# Patient Record
Sex: Male | Born: 1980
Health system: Southern US, Community
[De-identification: ages and names within clinical notes are randomized; demographics above are authoritative.]

## PROBLEM LIST (undated history)

## (undated) DIAGNOSIS — G43909 Migraine, unspecified, not intractable, without status migrainosus: Secondary | ICD-10-CM

## (undated) DIAGNOSIS — G4730 Sleep apnea, unspecified: Secondary | ICD-10-CM

## (undated) DIAGNOSIS — J45909 Unspecified asthma, uncomplicated: Secondary | ICD-10-CM

## (undated) HISTORY — DX: Unspecified asthma, uncomplicated: J45.909

## (undated) HISTORY — PX: OTHER SURGICAL HISTORY: SHX169

## (undated) HISTORY — DX: Migraine, unspecified, not intractable, without status migrainosus: G43.909

## (undated) HISTORY — DX: Sleep apnea, unspecified: G47.30

---

## 2008-08-15 ENCOUNTER — Ambulatory Visit: Payer: Self-pay | Admitting: Internal Medicine

## 2008-08-15 DIAGNOSIS — J3489 Other specified disorders of nose and nasal sinuses: Secondary | ICD-10-CM

## 2013-09-15 ENCOUNTER — Ambulatory Visit
Admission: RE | Admit: 2013-09-15 | Discharge: 2013-09-15 | Disposition: A | Discharge status: Home / Self Care | Payer: No Typology Code available for payment source | Source: Ambulatory Visit | Attending: Occupational Medicine | Admitting: Occupational Medicine

## 2013-09-15 ENCOUNTER — Other Ambulatory Visit: Payer: Self-pay | Admitting: Occupational Medicine

## 2013-09-15 DIAGNOSIS — Z021 Encounter for pre-employment examination: Secondary | ICD-10-CM

## 2014-05-11 ENCOUNTER — Emergency Department
Admission: EM | Admit: 2014-05-11 | Discharge: 2014-05-11 | Disposition: A | Discharge status: Home / Self Care | Payer: 59 | Source: Home / Self Care | Attending: Emergency Medicine | Admitting: Emergency Medicine

## 2014-05-11 ENCOUNTER — Encounter: Payer: Self-pay | Admitting: Emergency Medicine

## 2014-05-11 ENCOUNTER — Emergency Department (INDEPENDENT_AMBULATORY_CARE_PROVIDER_SITE_OTHER): Payer: 59

## 2014-05-11 DIAGNOSIS — K8050 Calculus of bile duct without cholangitis or cholecystitis without obstruction: Secondary | ICD-10-CM

## 2014-05-11 DIAGNOSIS — R9389 Abnormal findings on diagnostic imaging of other specified body structures: Secondary | ICD-10-CM

## 2014-05-11 DIAGNOSIS — K8020 Calculus of gallbladder without cholecystitis without obstruction: Secondary | ICD-10-CM

## 2014-05-11 DIAGNOSIS — R1011 Right upper quadrant pain: Secondary | ICD-10-CM

## 2014-05-11 LAB — COMPREHENSIVE METABOLIC PANEL
ALK PHOS: 114 U/L (ref 39–117)
ALT: 44 U/L (ref 0–53)
AST: 27 U/L (ref 0–37)
Albumin: 4.8 g/dL (ref 3.5–5.2)
BUN: 20 mg/dL (ref 6–23)
CO2: 31 mEq/L (ref 19–32)
Calcium: 9.3 mg/dL (ref 8.4–10.5)
Chloride: 102 mEq/L (ref 96–112)
Creat: 1.13 mg/dL (ref 0.50–1.35)
Glucose, Bld: 97 mg/dL (ref 70–99)
Potassium: 4.3 mEq/L (ref 3.5–5.3)
SODIUM: 138 meq/L (ref 135–145)
TOTAL PROTEIN: 7.5 g/dL (ref 6.0–8.3)
Total Bilirubin: 0.5 mg/dL (ref 0.2–1.2)

## 2014-05-11 LAB — POCT URINALYSIS DIP (MANUAL ENTRY)
Bilirubin, UA: NEGATIVE
Blood, UA: NEGATIVE
GLUCOSE UA: NEGATIVE
Ketones, POC UA: NEGATIVE
Leukocytes, UA: NEGATIVE
Nitrite, UA: NEGATIVE
PROTEIN UA: NEGATIVE
UROBILINOGEN UA: 0.2 (ref 0–1)
pH, UA: 6 (ref 5–8)

## 2014-05-11 LAB — POCT CBC W AUTO DIFF (K'VILLE URGENT CARE)

## 2014-05-11 LAB — AMYLASE: AMYLASE: 39 U/L (ref 0–105)

## 2014-05-11 LAB — LIPASE: Lipase: 49 U/L (ref 0–75)

## 2014-05-11 MED ORDER — HYDROCODONE-ACETAMINOPHEN 5-325 MG PO TABS
1.0000 | ORAL_TABLET | ORAL | Status: DC | PRN
Start: 2014-05-11 — End: 2014-08-24

## 2014-05-11 MED ORDER — GI COCKTAIL ~~LOC~~: 30.0000 mL | Freq: Once | ORAL | Status: DC

## 2014-05-11 NOTE — Discharge Instructions (Signed)
On the ultrasound, there is evidence of a 3 mm gallstone, but no evidence of blockage or cholecystitis.  CBC normal. Urinalysis normal. Complete metabolic panel, including liver tests are all within normal limits. Amylase and lipase (pancreas tests) normal.  Medications: GI cocktail given today in urgent care. Take Prilosec twice a day x14 days. Vicodin only if needed for severe pain. Followup with gastroenterologist (GI specialty Dr.) In one week. If any severe or worsening symptoms, go to emergency room immediately.   Biliary Colic  Biliary colic is a steady or irregular pain in the upper abdomen. It is usually under the right side of the rib cage. It happens when gallstones interfere with the normal flow of bile from the gallbladder. Bile is a liquid that helps to digest fats. Bile is made in the liver and stored in the gallbladder. When you eat a meal, bile passes from the gallbladder through the cystic duct and the common bile duct into the small intestine. There, it mixes with partially digested food. If a gallstone blocks either of these ducts, the normal flow of bile is blocked. The muscle cells in the bile duct contract forcefully to try to move the stone. This causes the pain of biliary colic.  SYMPTOMS   A person with biliary colic usually complains of pain in the upper abdomen. This pain can be:  In the center of the upper abdomen just below the breastbone.  In the upper-right part of the abdomen, near the gallbladder and liver.  Spread back toward the right shoulder blade.  Nausea and vomiting.  The pain usually occurs after eating.  Biliary colic is usually triggered by the digestive system's demand for bile. The demand for bile is high after fatty meals. Symptoms can also occur when a person who has been fasting suddenly eats a very large meal. Most episodes of biliary colic pass after 1 to 5 hours. After the most intense pain passes, your abdomen may continue to ache  mildly for about 24 hours. DIAGNOSIS  After you describe your symptoms, your caregiver will perform a physical exam. He or she will pay attention to the upper right portion of your belly (abdomen). This is the area of your liver and gallbladder. An ultrasound will help your caregiver look for gallstones. Specialized scans of the gallbladder may also be done. Blood tests may be done, especially if you have fever or if your pain persists. PREVENTION  Biliary colic can be prevented by controlling the risk factors for gallstones. Some of these risk factors, such as heredity, increasing age, and pregnancy are a normal part of life. Obesity and a high-fat diet are risk factors you can change through a healthy lifestyle. Women going through menopause who take hormone replacement therapy (estrogen) are also more likely to develop biliary colic. TREATMENT   Pain medication may be prescribed.  You may be encouraged to eat a fat-free diet.  If the first episode of biliary colic is severe, or episodes of colic keep retuning, surgery to remove the gallbladder (cholecystectomy) is usually recommended. This procedure can be done through small incisions using an instrument called a laparoscope. The procedure often requires a brief stay in the hospital. Some people can leave the hospital the same day. It is the most widely used treatment in people troubled by painful gallstones. It is effective and safe, with no complications in more than 90% of cases.  If surgery cannot be done, medication that dissolves gallstones may be used. This medication is  expensive and can take months or years to work. Only small stones will dissolve.  Rarely, medication to dissolve gallstones is combined with a procedure called shock-wave lithotripsy. This procedure uses carefully aimed shock waves to break up gallstones. In many people treated with this procedure, gallstones form again within a few years. PROGNOSIS  If gallstones block  your cystic duct or common bile duct, you are at risk for repeated episodes of biliary colic. There is also a 25% chance that you will develop a gallbladder infection(acute cholecystitis), or some other complication of gallstones within 10 to 20 years. If you have surgery, schedule it at a time that is convenient for you and at a time when you are not sick. HOME CARE INSTRUCTIONS   Drink plenty of clear fluids.  Avoid fatty, greasy or fried foods, or any foods that make your pain worse.  Take medications as directed. SEEK MEDICAL CARE IF:   You develop a fever over 100.5 F (38.1 C).  Your pain gets worse over time.  You develop nausea that prevents you from eating and drinking.  You develop vomiting. SEEK IMMEDIATE MEDICAL CARE IF:   You have continuous or severe belly (abdominal) pain which is not relieved with medications.  You develop nausea and vomiting which is not relieved with medications.  You have symptoms of biliary colic and you suddenly develop a fever and shaking chills. This may signal cholecystitis. Call your caregiver immediately.  You develop a yellow color to your skin or the white part of your eyes (jaundice). Document Released: 03/30/2006 Document Revised: 01/19/2012 Document Reviewed: 06/08/2008 [facility] Patient Information 2015 [employer] [place]. This information is not intended to replace advice given to you by your health care provider. Make sure you discuss any questions you have with your health care provider.

## 2014-05-11 NOTE — ED Notes (Signed)
Michael NoaWilliam complains of RUQ pain for 4 days. He reports the pain is worse at night while lying down. Denies fever, chills, sweats or blood in urine.

## 2014-05-11 NOTE — ED Provider Notes (Signed)
CSN: [id number]     Arrival date & time 05/11/14  0845 History   First MD Initiated Contact with Patient 05/11/14 [phone]     Chief Complaint  Patient presents with  . 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  [place] and reports not eating very well especially after being on recent diet.  She reports that the pain is located in the report right quadrant, worse at night, not colicky, mild during the day and moderate at night.  He does not recognize any pattern although he has not eaten any greasy fatty foods in the last few days because he is back on his diet.  Hurts when he takes a deep breath.  Rest does not help.  No trauma to that area, no heavy lifting pushing or pulling since he was on medication.  Medicines have not helped.  No hematuria, dysuria, polyuria.  No fever, chills, vomiting, night sweats.    History reviewed. No pertinent past medical history. History reviewed. No pertinent past surgical history. History reviewed. No pertinent family history. History  Substance Use Topics  . Smoking status: Never Smoker   . Smokeless tobacco: Never Used  . Alcohol Use: Yes    Review of Systems  All other systems reviewed and are negative.   Allergies  Review of patient's allergies indicates no known allergies.  Home Medications   Prior to Admission medications   Medication Sig Start Date End Date Taking? Authorizing Provider  fexofenadine (ALLEGRA) 180 MG tablet Take 180 mg by mouth daily.   Yes Historical Provider, MD   BP 134/86  Pulse 65  Temp(Src) 97.4 F (36.3 C) (Oral)  Ht 5\' 10"  (1.778 m)  Wt 249 lb (112.946 kg)  BMI 35.73 kg/m2  SpO2 98% Physical Exam  Nursing note and vitals reviewed. Constitutional: He is oriented to person, place, and time.  Vital signs are normal. He appears well-developed and well-nourished.  Non-toxic appearance. No distress.  HENT:  Head: Normocephalic and atraumatic.  Eyes: No scleral icterus.  Neck: Neck supple.  Cardiovascular: Normal rate, regular rhythm and normal heart sounds.   Pulmonary/Chest: Effort normal and breath sounds normal. No respiratory distress. He has no decreased breath sounds. He has no wheezes. He has no rhonchi.  Abdominal: Soft. Normal appearance. He exhibits no abdominal bruit, no ascites and no mass. There is no hepatosplenomegaly. There is tenderness in the right upper quadrant. There is positive Murphy's sign. There is no rigidity, no rebound, no guarding, no CVA tenderness and no tenderness at McBurney's point.  No tenderness on his right lower ribs.  No other tenderness throughout his abdomen, groin, back, flank.  Neurological: He is alert and oriented to person, place, and time.  Skin: Skin is warm and dry.  Psychiatric: He has a normal mood and affect. His speech is normal.    ED Course  Procedures (including critical care time) Labs Review Labs Reviewed  URINE CULTURE  COMPREHENSIVE METABOLIC PANEL  AMYLASE  LIPASE  POCT URINALYSIS DIP (MANUAL ENTRY)  POCT CBC W AUTO DIFF ([facility])    Imaging Review No results found. Results for orders placed during the hospital encounter of 05/11/14  POCT URINALYSIS DIP (MANUAL ENTRY)      Result Value Ref Range   Color, UA yellow     Clarity, UA clear  Glucose, UA neg     Bilirubin, UA negative     Bilirubin, UA negative     Spec Grav, UA <=1.005  1.005 - 1.03   Blood, UA negative     pH, UA 6.0  5 - 8   Protein Ur, POC negative     Urobilinogen, UA 0.2  0 - 1   Nitrite, UA Negative     Leukocytes, UA Negative        MDM   1. Abdominal pain, right upper quadrant     Urinalysis is performed which is negative for blood.  Urine culture was sent out.  An in-house a CBC was performed which has a white  blood cell count of 7.9 with a normal differential, hemoglobin 16.4, and platelets of 186.  Stat labs were sent for CMP, amylase, and lipase.  Most likely diagnosis is gallbladder etiology.  I have ordered a ultrasound of his abdomen to be performed today.  Afterward he will return to clinic for further evaluation and test results.  I opted for the ultrasound first prior to CT since he has been no right lower quadrant pain, however if continuing or worsening then a CT of his abdomen may be required to rule out appendix, kidney stones, etc all.  Patient is to be NPO until the test is performed.  Dr. Georgina PillionMassey will see patient when he returns later today to clinic.  Marlaine HindJeffrey H Trevin Gartrell, MD 05/11/14 [phone]

## 2014-05-11 NOTE — ED Provider Notes (Signed)
CSN: [id number]     Arrival date & time 05/11/14  0845 History   First MD Initiated Contact with Patient 05/11/14 [phone]     Chief Complaint  Patient presents with  . Abdominal Pain   These notes are continuation of visit here in urgent care. Please see Dr. Donnamarie Poag notes from here today in urgent care.--Dr. Orson Aloe signed out information about patient before finishing his urgent care shift.--The patient has just returned from abdominal ultrasound done here in our facility.  Please see below under imaging and MDM HPI  History reviewed. No pertinent past medical history. History reviewed. No pertinent past surgical history. History reviewed. No pertinent family history. History  Substance Use Topics  . Smoking status: Never Smoker   . Smokeless tobacco: Never Used  . Alcohol Use: Yes    Review of Systems  Allergies  Review of patient's allergies indicates no known allergies.  Home Medications   Prior to Admission medications   Medication Sig Start Date End Date Taking? Authorizing Provider  fexofenadine (ALLEGRA) 180 MG tablet Take 180 mg by mouth daily.   Yes Historical Provider, MD  HYDROcodone-acetaminophen (NORCO/VICODIN) 5-325 MG per tablet Take 1-2 tablets by mouth every 4 (four) hours as needed for severe pain. Take with food. 05/11/14   Lajean Manes, MD   BP 134/86  Pulse 65  Temp(Src) 97.4 F (36.3 C) (Oral)  Ht 5\' 10"  (1.778 m)  Wt 249 lb (112.946 kg)  BMI 35.73 kg/m2  SpO2 98% Physical Exam  ED Course  Procedures (including critical care time) Labs Review Labs Reviewed  POCT URINALYSIS DIP (MANUAL ENTRY) - Normal  POCT CBC W AUTO DIFF ([facility]) - Normal  URINE CULTURE  COMPREHENSIVE METABOLIC PANEL   Narrative:    Performed at:  [facility]                [address]                [place], [place] [address]  AMYLASE   Narrative:    Performed at:  [facility]  [address]                [place], [place] [address]  LIPASE   Narrative:    Performed at:  [facility]                [address]                [place], [place] [address]   Results for orders placed during the hospital encounter of 05/11/14  COMPREHENSIVE METABOLIC PANEL      Result Value Ref Range   Sodium 138  135 - 145 mEq/L   Potassium 4.3  3.5 - 5.3 mEq/L   Chloride 102  96 - 112 mEq/L   CO2 31  19 - 32 mEq/L   Glucose, Bld 97  70 - 99 mg/dL   BUN 20  6 - 23 mg/dL   Creat 4.40  1.02 - 7.25 mg/dL   Total Bilirubin 0.5  0.2 - 1.2 mg/dL   Alkaline Phosphatase 114  39 - 117 U/L   AST 27  0 - 37 U/L   ALT 44  0 - 53 U/L   Total Protein 7.5  6.0 - 8.3 g/dL   Albumin 4.8  3.5 - 5.2 g/dL  Calcium 9.3  8.4 - 10.5 mg/dL  AMYLASE      Result Value Ref Range   Amylase 39  0 - 105 U/L  LIPASE      Result Value Ref Range   Lipase 49  0 - 75 U/L  POCT URINALYSIS DIP (MANUAL ENTRY)      Result Value Ref Range   Color, UA yellow     Clarity, UA clear     Glucose, UA neg     Bilirubin, UA negative     Bilirubin, UA negative     Spec Grav, UA <=1.005  1.005 - 1.03   Blood, UA negative     pH, UA 6.0  5 - 8   Protein Ur, POC negative     Urobilinogen, UA 0.2  0 - 1   Nitrite, UA Negative     Leukocytes, UA Negative    POCT CBC W AUTO DIFF ([facility])      Result Value Ref Range   WBC    4.5 - 10.5 K/uL   Lymphocytes relative %    15 - 45 %   Monocytes relative %    2 - 10 %   Neutrophils relative % (GR)    44 - 76 %   Lymphocytes absolute    0.1 - 1.8 K/uL   Monocyes absolute    0.1 - 1 K/uL   Neutrophils absolute (GR#)    1.7 - 7.7 K/uL   RBC    4.2 - 5.8 MIL/uL   Hemoglobin    13 - 17 g/dL   Hematocrit    16.138.5 - 51 %   MCV    80 - 98 fL   MCH    26.5 - 32.5 pg   MCHC    32.5 - 36.9 g/dL   RDW    09.611.6 - 14 %   Platelet count    140 - 400 K/uL   MPV    7.8 - 11 fL    Imaging Review [place] Abdomen  Complete  05/11/2014   CLINICAL DATA:  Four-day history of right upper quadrant pain.  EXAM: ULTRASOUND ABDOMEN COMPLETE  COMPARISON:  None.  FINDINGS: Gallbladder:  There is a non mobile non shadowing. 3 mm diameter. Echogenic focus near the gallbladder neck. This may reflect an adherent stone or polyp. There is no gallbladder wall thickening or pericholecystic fluid or positive sonographic Murphy's sign.  Common bile duct:  Diameter: 3 mm  Liver:  No focal lesion identified. Within normal limits in parenchymal echogenicity.  IVC:  No abnormality where visualized.  Pancreas:  Visualized portion unremarkable.  Bowel gas does limit evaluation  Spleen:  Size and appearance within normal limits.  Right Kidney:  Length: 12.4 cm. Echogenicity within normal limits. No mass or hydronephrosis visualized.  Left Kidney:  Length: 11 cm. Echogenicity within normal limits. No mass or hydronephrosis visualized.  Abdominal aorta:  Bowel gas limits evaluation of the abdominal aorta.  Other findings:  No ascites.  IMPRESSION: 1. There is no sonographic evidence of acute cholecystitis. There is a 3 mm diameter adherent non shadowing stone versus polyp. 2. No acute abnormalities demonstrated elsewhere within the abdomen.   Electronically Signed   By: David  [place]   On: 05/11/2014 14:20     MDM   1. Abdominal pain, right upper quadrant   2. GS (gallstone)   3. Biliary colic    Right upper quadrant pain currently 2/10. We reviewed reviewed results  of stat blood tests, all within normal limits WBC 7.9 with normal differential. Hemoglobin 16.4    platelets normal . CMP, lipase, amylase, all normal. Urinalysis normal  Reviewed above abdominal ultrasound. "3 mm echogenic focus is near the gallbladder neck.... Stone vs polyp...Marland Kitchen.Marland Kitchen.Marland Kitchen. "No sonographic evidence of acute cholecystitis...... No acute abnormalities elsewhere within the abdomen"  Treatment options discussed, as well as risks, benefits, alternatives. Patient voiced  understanding and agreement with the following plans: GI cocktail given to patient. Reevaluated 20 minutes later. No significant change.   Medications: GI cocktail given today in urgent care. Take Prilosec twice a day x14 days. Vicodin only if needed for severe pain. An After Visit Summary was printed and given to the patient.--Included dietary instructions about pushing clear liquids and eating bland diet. And instructions about biliary colic.  Followup with gastroenterologist (GI specialty Dr.). [facility]  If any severe or worsening symptoms, go to emergency room immediately. Precautions discussed. Red flags discussed. Questions invited and answered. Patient voiced understanding and agreement.   Lajean Manesavid Massey, MD 05/11/14 [phone]

## 2014-05-12 LAB — URINE CULTURE
COLONY COUNT: NO GROWTH
ORGANISM ID, BACTERIA: NO GROWTH

## 2014-05-15 ENCOUNTER — Telehealth: Payer: Self-pay

## 2014-05-15 NOTE — ED Notes (Signed)
Left a message on voice mail asking how patient is feeling and advising to call back with any questions or concerns. His labs were discussed at his visit.

## 2014-08-22 ENCOUNTER — Emergency Department (HOSPITAL_COMMUNITY): Payer: 59

## 2014-08-22 ENCOUNTER — Emergency Department (HOSPITAL_COMMUNITY)
Admission: EM | Admit: 2014-08-22 | Discharge: 2014-08-22 | Disposition: A | Discharge status: Home / Self Care | Payer: 59 | Attending: Emergency Medicine | Admitting: Emergency Medicine

## 2014-08-22 ENCOUNTER — Encounter (HOSPITAL_COMMUNITY): Payer: Self-pay | Admitting: Emergency Medicine

## 2014-08-22 DIAGNOSIS — R519 Headache, unspecified: Secondary | ICD-10-CM

## 2014-08-22 DIAGNOSIS — R51 Headache: Secondary | ICD-10-CM | POA: Diagnosis not present

## 2014-08-22 LAB — CBC
HCT: 42.8 % (ref 39.0–52.0)
Hemoglobin: 15.3 g/dL (ref 13.0–17.0)
MCH: 28.8 pg (ref 26.0–34.0)
MCHC: 35.7 g/dL (ref 30.0–36.0)
MCV: 80.5 fL (ref 78.0–100.0)
Platelets: 174 10*3/uL (ref 150–400)
RBC: 5.32 MIL/uL (ref 4.22–5.81)
RDW: 13.5 % (ref 11.5–15.5)
WBC: 9.8 10*3/uL (ref 4.0–10.5)

## 2014-08-22 LAB — BASIC METABOLIC PANEL
Anion gap: 13 (ref 5–15)
BUN: 13 mg/dL (ref 6–23)
CO2: 24 mEq/L (ref 19–32)
Calcium: 9.5 mg/dL (ref 8.4–10.5)
Chloride: 103 mEq/L (ref 96–112)
Creatinine, Ser: 0.98 mg/dL (ref 0.50–1.35)
GFR calc Af Amer: >90 mL/min (ref >90)
GFR calc non Af Amer: >90 mL/min (ref >90)
Glucose, Bld: 107 mg/dL — ABNORMAL HIGH (ref 70–99)
Potassium: 4.3 mEq/L (ref 3.7–5.3)
Sodium: 140 mEq/L (ref 137–147)

## 2014-08-22 MED ORDER — MAGNESIUM SULFATE 40 MG/ML IJ SOLN
2.0000 g | Freq: Once | INTRAMUSCULAR | Status: AC
  Administered 2014-08-22: 2 g via INTRAVENOUS
  Filled 2014-08-22: qty 50

## 2014-08-22 MED ORDER — HYDROMORPHONE HCL 1 MG/ML IJ SOLN
1.0000 mg | Freq: Once | INTRAMUSCULAR | Status: AC
  Administered 2014-08-22: 1 mg via INTRAVENOUS
  Filled 2014-08-22: qty 1

## 2014-08-22 MED ORDER — ONDANSETRON HCL 4 MG/2ML IJ SOLN
4.0000 mg | Freq: Once | INTRAMUSCULAR | Status: AC
  Administered 2014-08-22: 4 mg via INTRAVENOUS
  Filled 2014-08-22: qty 2

## 2014-08-22 MED ORDER — KETOROLAC TROMETHAMINE 30 MG/ML IJ SOLN
30.0000 mg | Freq: Once | INTRAMUSCULAR | Status: AC
  Administered 2014-08-22: 30 mg via INTRAVENOUS
  Filled 2014-08-22: qty 1

## 2014-08-22 MED ORDER — GABAPENTIN 300 MG PO CAPS
300.0000 mg | ORAL_CAPSULE | Freq: Three times a day (TID) | ORAL | Status: DC
Start: 2014-08-22 — End: 2014-08-22
  Administered 2014-08-22: 300 mg via ORAL
  Filled 2014-08-22: qty 1

## 2014-08-22 MED ORDER — GABAPENTIN 300 MG PO CAPS: 300.0000 mg | ORAL_CAPSULE | Freq: Three times a day (TID) | ORAL | Status: DC

## 2014-08-22 MED ORDER — DIPHENHYDRAMINE HCL 50 MG/ML IJ SOLN
25.0000 mg | Freq: Once | INTRAMUSCULAR | Status: AC
  Administered 2014-08-22: 25 mg via INTRAVENOUS
  Filled 2014-08-22: qty 1

## 2014-08-22 MED ORDER — PROCHLORPERAZINE EDISYLATE 5 MG/ML IJ SOLN
10.0000 mg | Freq: Once | INTRAMUSCULAR | Status: AC
  Administered 2014-08-22: 10 mg via INTRAVENOUS
  Filled 2014-08-22: qty 2

## 2014-08-22 MED ORDER — SODIUM CHLORIDE 0.9 % IV BOLUS (SEPSIS)
1000.0000 mL | Freq: Once | INTRAVENOUS | Status: AC
  Administered 2014-08-22: 1000 mL via INTRAVENOUS

## 2014-08-22 MED ORDER — ACETAMINOPHEN 325 MG PO TABS
650.0000 mg | ORAL_TABLET | Freq: Once | ORAL | Status: AC
  Administered 2014-08-22: 650 mg via ORAL
  Filled 2014-08-22: qty 2

## 2014-08-22 MED ORDER — VALPROATE SODIUM 500 MG/5ML IV SOLN
500.0000 mg | Freq: Once | INTRAVENOUS | Status: AC
  Administered 2014-08-22: 500 mg via INTRAVENOUS
  Filled 2014-08-22 (×2): qty 5

## 2014-08-22 NOTE — ED Provider Notes (Signed)
CSN: [id number]     Arrival date & time 08/22/14  1131 History   First MD Initiated Contact with Patient 08/22/14 1151     Chief Complaint  Patient presents with  . Headache     (Consider location/radiation/quality/duration/timing/severity/associated sxs/prior Treatment) HPI Pt is a 33yo male presenting to ED from PCP, Dr. Amador CunasKwiatkowski, with c/o severe, gradually worsening headache that started around 1AM this morning. Associated with nausea, blurred vision "difficulty focusing, blurred in my side vision."  When headaches are severe he will get numbness on one side of his body, different each time.  Denies numbness at this time.  States he has been under a lot of stress lately. Has had intermittent headaches for the last 2 weeks. Initially headaches would last 5-7820min at a time but this headache has been constant since onset this morning.  Pt states he had been taking Advil which was not helping but Excedrin was until today. Denies previous headaches prior to 2 weeks ago. Denies recent head trauma. Denies fever, chills or neck pain.  Denies back pain, weakness, change in bowel or bladder habits. No hx of HTN.  History reviewed. No pertinent past medical history. History reviewed. No pertinent past surgical history. Family History  Problem Relation Age of Onset  . Hypertension Mother   . Hypertension Father    History  Substance Use Topics  . Smoking status: Never Smoker   . Smokeless tobacco: Never Used  . Alcohol Use: Yes    Review of Systems  Constitutional: Negative for fever, chills, diaphoresis and fatigue.  Eyes: Positive for photophobia and visual disturbance (blurred peripheral vision, difficult focusing ).  Respiratory: Negative for cough and shortness of breath.   Cardiovascular: Negative for chest pain and palpitations.  Gastrointestinal: Positive for nausea and vomiting. Negative for abdominal pain, diarrhea and constipation.  Musculoskeletal: Negative for back pain,  myalgias, neck pain and neck stiffness.  Neurological: Positive for numbness and headaches. Negative for dizziness, tremors, seizures, syncope, speech difficulty, weakness and light-headedness.  All other systems reviewed and are negative.     Allergies  Review of patient's allergies indicates no known allergies.  Home Medications   Prior to Admission medications   Medication Sig Start Date End Date Taking? Authorizing Provider  aspirin-acetaminophen-caffeine (EXCEDRIN MIGRAINE) [phone] MG per tablet Take 1 tablet by mouth every 6 (six) hours as needed for headache.   Yes Historical Provider, MD  fexofenadine (ALLEGRA) 180 MG tablet Take 180 mg by mouth daily.   Yes Historical Provider, MD  HYDROcodone-acetaminophen (NORCO/VICODIN) 5-325 MG per tablet Take 1-2 tablets by mouth every 4 (four) hours as needed for severe pain. Take with food. 05/11/14  Yes Lajean Manesavid Massey, MD  gabapentin (NEURONTIN) 300 MG capsule Take 1 capsule (300 mg total) by mouth 3 (three) times daily. 08/22/14   Mora BellmanHannah S Merrell, PA-C   BP 114/74  Pulse 73  Temp(Src) 97.3 F (36.3 C) (Oral)  Resp 14  Ht 5\' 10"  (1.778 m)  Wt 252 lb (114.306 kg)  BMI 36.16 kg/m2  SpO2 96% Physical Exam  Nursing note and vitals reviewed. Constitutional: He is oriented to person, place, and time. He appears well-developed and well-nourished. He appears distressed.  Pt lying in exam bed with sunglasses on, dimmed exam room. Speaking in soft voice, appears uncomfortable.   HENT:  Head: Normocephalic and atraumatic.  Eyes: Conjunctivae and EOM are normal. Pupils are equal, round, and reactive to light. No scleral icterus.  Neck: Normal range of motion. Neck supple.  No midline bone tenderness, no crepitus or step-offs.   No nuchal rigidity or meningeal signs.  Cardiovascular: Normal rate, regular rhythm and normal heart sounds.   Pulmonary/Chest: Effort normal and breath sounds normal. No respiratory distress. He has no wheezes. He  has no rales. He exhibits no tenderness.  Abdominal: Soft. Bowel sounds are normal. He exhibits no distension and no mass. There is no tenderness. There is no rebound and no guarding.  Musculoskeletal: Normal range of motion.  Neurological: He is alert and oriented to person, place, and time. He has normal strength. No cranial nerve deficit or sensory deficit. Coordination normal. GCS eye subscore is 4. GCS verbal subscore is 5. GCS motor subscore is 6.  Alert and oriented to person place and time. Speech is fluent. FROM all extremities. 5/5 strength in upper and lower extremities bilaterally.   Skin: Skin is warm and dry.    ED Course  Procedures (including critical care time) Labs Review Labs Reviewed  BASIC METABOLIC PANEL - Abnormal; Notable for the following:    Glucose, Bld 107 (*)    All other components within normal limits  CBC    Imaging Review Ct Head Wo Contrast  08/22/2014   CLINICAL DATA:  Headache starting 2 weeks ago, vomiting x5 today, face numbness  EXAM: CT HEAD WITHOUT CONTRAST  TECHNIQUE: Contiguous axial images were obtained from the base of the skull through the vertex without intravenous contrast.  COMPARISON:  None  FINDINGS: No skull fracture is noted. Paranasal sinuses and mastoid air cells are unremarkable. No hydrocephalus. The gray and white-matter differentiation is preserved. No acute infarction. No mass lesion is noted on this unenhanced scan. No intra or extra-axial fluid collection. No hydrocephalus.  IMPRESSION: No acute intracranial abnormality.   Electronically Signed   By: Natasha MeadLiviu  Pop M.D.   On: 08/22/2014 12:39   Mr Brain Wo Contrast  08/22/2014   CLINICAL DATA:  Headache for several days with alternating laterality, suspected complicated migraine.  EXAM: MRI HEAD WITHOUT CONTRAST  TECHNIQUE: Multiplanar, multiecho pulse sequences of the brain and surrounding structures were obtained without intravenous contrast.  COMPARISON:  CT head 08/22/2014.  MRV  reported separately.  FINDINGS: The patient was unable to remain motionless for the exam. Small or subtle lesions could be overlooked.  No evidence for acute infarction, hemorrhage, mass lesion, hydrocephalus, or extra-axial fluid. Normal for age cerebral volume. Abnormal predominantly frontal LEFT greater than RIGHT periventricular white matter signal abnormality is atypical in distribution and premature for age. There are no subcortical, brainstem, or cerebellar white matter abnormalities. Considerations would include ischemic demyelination from complicated migraine, premature chronic microvascular ischemic change, chronic infection, demyelinating disease, or vasculitis. Flow voids are maintained, with no foci of chronic hemorrhage.  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 Correlate clinically for headaches related to pseudotumor cerebri. Slight tonsillar ectopia. Upper cervical region unremarkable. Extracranial soft tissues within normal limits.  Compared with CT head, the white matter changes are not visible.  IMPRESSION: Abnormal predominantly frontal, LEFT greater than RIGHT, periventricular white matter signal abnormality is atypical in distribution and premature for age. See discussion above. Ischemic demyelination from complicated migraine not excluded. Other considerations described above.  Empty sella. Correlate clinically for relationship to the patient's headaches.   Electronically Signed   By: Davonna BellingJohn  Curnes M.D.   On: 08/22/2014 18:58   Mr Alexandria LodgeVenogram Head  08/22/2014   CLINICAL  DATA:  Atypical headache, evaluate for venous thrombosis.  EXAM: MR VENOGRAM HEAD WITHOUT CONTRAST  TECHNIQUE: Angiographic images of the intracranial venous structures were obtained using MRV technique without intravenous contrast.  COMPARISON:  MRI brain reported separately.  CT head earlier today.  FINDINGS:  The superior sagittal sinus is widely patent. The transverse sinuses and sigmoid sinuses are widely patent. Both internal jugular veins appear symmetric without visible thrombus.  The deep venous system is widely patent. Both internal cerebral veins are seen and supply a normal [place]. The straight sinus is unremarkable.  There is no visible asymmetry of cortical venous drainage.  IMPRESSION: Normal MR venogram of the intracranial circulation.   Electronically Signed   By: Davonna Belling M.D.   On: 08/22/2014 19:03     EKG Interpretation None      MDM   Final diagnoses:  Acute nonintractable headache, unspecified headache type    Pt is a 33yo male presenting to ED from PCP with c/o gradually worsening headache, associated with nausea, vomiting, and blurred vision. HAs started 2 weeks ago, intermittent. No previous hx of headaches. No head trauma. Will get head CT due to new onset, severe headaches. No meningeal signs.   1:13 PM CT scan: no acute intracranial abnormality. Pt just now getting dilaudid.  Will reevaluate.  1:51 PM Consulted with Dr. Juleen China who reexamined pt, concerned for complicated migraine. Pt provided additional hx including episodes concerning for expressive aphasia. States at times it is difficult for him to get the words out of his mouth, had to wrist on a piece of paper to communicate with his wife, several words were misspelled.  No apparent speech difficulties in ED, however, will consult with neurology for further guidance of tx and f/u.  2:17 PM consulted with neurology, Felicie Morn, PA-C to see pt.    Pt was signed out to [employer] PA-C at shift change to f/u on MRI.   Junius Finner, PA-C 08/23/14 1236

## 2014-08-22 NOTE — Discharge Instructions (Signed)
Please follow up with one of the neurologists listed above, preferably the practice that can see you the soonest. Return to ED for new or worsening symptoms.   Headaches, Frequently Asked Questions MIGRAINE HEADACHES Q: What is migraine? What causes it? How can I treat it? A: Generally, migraine headaches begin as a dull ache. Then they develop into a constant, throbbing, and pulsating pain. You may experience pain at the temples. You may experience pain at the front or back of one or both sides of the head. The pain is usually accompanied by a combination of:  Nausea.  Vomiting.  Sensitivity to light and noise. Some people (about 15%) experience an aura (see below) before an attack. The cause of migraine is believed to be chemical reactions in the brain. Treatment for migraine may include over-the-counter or prescription medications. It may also include self-help techniques. These include relaxation training and biofeedback.  Q: What is an aura? A: About 15% of people with migraine get an "aura". This is a sign of neurological symptoms that occur before a migraine headache. You may see wavy or jagged lines, dots, or flashing lights. You might experience tunnel vision or blind spots in one or both eyes. The aura can include visual or auditory hallucinations (something imagined). It may include disruptions in smell (such as strange odors), taste or touch. Other symptoms include:  Numbness.  A "pins and needles" sensation.  Difficulty in recalling or speaking the correct word. These neurological events may last as long as 60 minutes. These symptoms will fade as the headache begins. Q: What is a trigger? A: Certain physical or environmental factors can lead to or "trigger" a migraine. These include:  Foods.  Hormonal changes.  Weather.  Stress. It is important to remember that triggers are different for everyone. To help prevent migraine attacks, you need to figure out which triggers  affect you. Keep a headache diary. This is a good way to track triggers. The diary will help you talk to your healthcare professional about your condition. Q: Does weather affect migraines? A: Bright sunshine, hot, humid conditions, and drastic changes in barometric pressure may lead to, or "trigger," a migraine attack in some people. But studies have shown that weather does not act as a trigger for everyone with migraines. Q: What is the link between migraine and hormones? A: Hormones start and regulate many of your body's functions. Hormones keep your body in balance within a constantly changing environment. The levels of hormones in your body are unbalanced at times. Examples are during menstruation, pregnancy, or menopause. That can lead to a migraine attack. In fact, about three quarters of all women with migraine report that their attacks are related to the menstrual cycle.  Q: Is there an increased risk of stroke for migraine sufferers? A: The likelihood of a migraine attack causing a stroke is very remote. That is not to say that migraine sufferers cannot have a stroke associated with their migraines. In persons under age 35, the most common associated factor for stroke is migraine headache. But over the course of a person's normal life span, the occurrence of migraine headache may actually be associated with a reduced risk of dying from cerebrovascular disease due to stroke.  Q: What are acute medications for migraine? A: Acute medications are used to treat the pain of the headache after it has started. Examples over-the-counter medications, NSAIDs, ergots, and triptans.  Q: What are the triptans? A: Triptans are the newest class of abortive  medications. They are specifically targeted to treat migraine. Triptans are vasoconstrictors. They moderate some chemical reactions in the brain. The triptans work on receptors in your brain. Triptans help to restore the balance of a neurotransmitter called  serotonin. Fluctuations in levels of serotonin are thought to be a main cause of migraine.  Q: Are over-the-counter medications for migraine effective? A: Over-the-counter, or "OTC," medications may be effective in relieving mild to moderate pain and associated symptoms of migraine. But you should see your caregiver before beginning any treatment regimen for migraine.  Q: What are preventive medications for migraine? A: Preventive medications for migraine are sometimes referred to as "prophylactic" treatments. They are used to reduce the frequency, severity, and length of migraine attacks. Examples of preventive medications include antiepileptic medications, antidepressants, beta-blockers, calcium channel blockers, and NSAIDs (nonsteroidal anti-inflammatory drugs). Q: Why are anticonvulsants used to treat migraine? A: During the past few years, there has been an increased interest in antiepileptic drugs for the prevention of migraine. They are sometimes referred to as "anticonvulsants". Both epilepsy and migraine may be caused by similar reactions in the brain.  Q: Why are antidepressants used to treat migraine? A: Antidepressants are typically used to treat people with depression. They may reduce migraine frequency by regulating chemical levels, such as serotonin, in the brain.  Q: What alternative therapies are used to treat migraine? A: The term "alternative therapies" is often used to describe treatments considered outside the scope of conventional Western medicine. Examples of alternative therapy include acupuncture, acupressure, and yoga. Another common alternative treatment is herbal therapy. Some herbs are believed to relieve headache pain. Always discuss alternative therapies with your caregiver before proceeding. Some herbal products contain arsenic and other toxins. TENSION HEADACHES Q: What is a tension-type headache? What causes it? How can I treat it? A: Tension-type headaches occur  randomly. They are often the result of temporary stress, anxiety, fatigue, or anger. Symptoms include soreness in your temples, a tightening band-like sensation around your head (a "vice-like" ache). Symptoms can also include a pulling feeling, pressure sensations, and contracting head and neck muscles. The headache begins in your forehead, temples, or the back of your head and neck. Treatment for tension-type headache may include over-the-counter or prescription medications. Treatment may also include self-help techniques such as relaxation training and biofeedback. CLUSTER HEADACHES Q: What is a cluster headache? What causes it? How can I treat it? A: Cluster headache gets its name because the attacks come in groups. The pain arrives with little, if any, warning. It is usually on one side of the head. A tearing or bloodshot eye and a runny nose on the same side of the headache may also accompany the pain. Cluster headaches are believed to be caused by chemical reactions in the brain. They have been described as the most severe and intense of any headache type. Treatment for cluster headache includes prescription medication and oxygen. SINUS HEADACHES Q: What is a sinus headache? What causes it? How can I treat it? A: When a cavity in the bones of the face and skull (a sinus) becomes inflamed, the inflammation will cause localized pain. This condition is usually the result of an allergic reaction, a tumor, or an infection. If your headache is caused by a sinus blockage, such as an infection, you will probably have a fever. An x-ray will confirm a sinus blockage. Your caregiver's treatment might include antibiotics for the infection, as well as antihistamines or decongestants.  REBOUND HEADACHES Q: What is a  rebound headache? What causes it? How can I treat it? A: A pattern of taking acute headache medications too often can lead to a condition known as "rebound headache." A pattern of taking too much  headache medication includes taking it more than 2 days per week or in excessive amounts. That means more than the label or a caregiver advises. With rebound headaches, your medications not only stop relieving pain, they actually begin to cause headaches. Doctors treat rebound headache by tapering the medication that is being overused. Sometimes your caregiver will gradually substitute a different type of treatment or medication. Stopping may be a challenge. Regularly overusing a medication increases the potential for serious side effects. Consult a caregiver if you regularly use headache medications more than 2 days per week or more than the label advises. ADDITIONAL QUESTIONS AND ANSWERS Q: What is biofeedback? A: Biofeedback is a self-help treatment. Biofeedback uses special equipment to monitor your body's involuntary physical responses. Biofeedback monitors:  Breathing.  Pulse.  Heart rate.  Temperature.  Muscle tension.  Brain activity. Biofeedback helps you refine and perfect your relaxation exercises. You learn to control the physical responses that are related to stress. Once the technique has been mastered, you do not need the equipment any more. Q: Are headaches hereditary? A: Four out of five (80%) of people that suffer report a family history of migraine. Scientists are not sure if this is genetic or a family predisposition. Despite the uncertainty, a child has a 50% chance of having migraine if one parent suffers. The child has a 75% chance if both parents suffer.  Q: Can children get headaches? A: By the time they reach high school, most young people have experienced some type of headache. Many safe and effective approaches or medications can prevent a headache from occurring or stop it after it has begun.  Q: What type of doctor should I see to diagnose and treat my headache? A: Start with your primary caregiver. Discuss his or her experience and approach to headaches. Discuss  methods of classification, diagnosis, and treatment. Your caregiver may decide to recommend you to a headache specialist, depending upon your symptoms or other physical conditions. Having diabetes, allergies, etc., may require a more comprehensive and inclusive approach to your headache. The National [employer] Foundation will provide, upon request, a list of [facility] physician members in your state. Document Released: 01/17/2004 Document Revised: 01/19/2012 Document Reviewed: 06/26/2008 [facility] Patient Information 2015 [place], [place]. This information is not intended to replace advice given to you by your health care provider. Make sure you discuss any questions you have with your health care provider.

## 2014-08-22 NOTE — ED Provider Notes (Signed)
Patient signed out to me by Gershon Mussel'Malley, PA-C at change of shift. Patient with atypical headaches x 2 weeks. Neuro evaluated and recommend MRI. If MRI is normal, patient may be discharged home.   8:18 PM Discussed case with Dr. Hosie PoissonSumner who evaluated patient. Patient will [facility] an [facility] or [facility].   Patient feeling improved. Will discharge with gabapentin. Discussed reasons to return to ED. Vital signs stable for discharge. Patient / Family / Caregiver informed of clinical course, understand medical decision-making process, and agree with plan.   Results for orders placed during the hospital encounter of 08/22/14  CBC      Result Value Ref Range   WBC 9.8  4.0 - 10.5 K/uL   RBC 5.32  4.22 - 5.81 MIL/uL   Hemoglobin 15.3  13.0 - 17.0 g/dL   HCT 29.542.8  18.839.0 - 41.652.0 %   MCV 80.5  78.0 - 100.0 fL   MCH 28.8  26.0 - 34.0 pg   MCHC 35.7  30.0 - 36.0 g/dL   RDW 60.613.5  30.111.5 - 60.115.5 %   Platelets 174  150 - 400 K/uL  BASIC METABOLIC PANEL      Result Value Ref Range   Sodium 140  137 - 147 mEq/L   Potassium 4.3  3.7 - 5.3 mEq/L   Chloride 103  96 - 112 mEq/L   CO2 24  19 - 32 mEq/L   Glucose, Bld 107 (*) 70 - 99 mg/dL   BUN 13  6 - 23 mg/dL   Creatinine, Ser 0.930.98  0.50 - 1.35 mg/dL   Calcium 9.5  8.4 - 23.510.5 mg/dL   GFR calc non Af Amer >90  >90 mL/min   GFR calc Af Amer >90  >90 mL/min   Anion gap 13  5 - 15   Ct Head Wo Contrast  08/22/2014   CLINICAL DATA:  Headache starting 2 weeks ago, vomiting x5 today, face numbness  EXAM: CT HEAD WITHOUT CONTRAST  TECHNIQUE: Contiguous axial images were obtained from the base of the skull through the vertex without intravenous contrast.  COMPARISON:  None  FINDINGS: No skull fracture is noted. Paranasal sinuses and mastoid air cells are unremarkable. No hydrocephalus. The gray and white-matter differentiation is preserved. No acute infarction. No mass lesion is noted on this unenhanced scan. No intra or  extra-axial fluid collection. No hydrocephalus.  IMPRESSION: No acute intracranial abnormality.   Electronically Signed   By: Natasha MeadLiviu  Pop M.D.   On: 08/22/2014 12:39   Mr Brain Wo Contrast  08/22/2014   CLINICAL DATA:  Headache for several days with alternating laterality, suspected complicated migraine.  EXAM: MRI HEAD WITHOUT CONTRAST  TECHNIQUE: Multiplanar, multiecho pulse sequences of the brain and surrounding structures were obtained without intravenous contrast.  COMPARISON:  CT head 08/22/2014.  MRV reported separately.  FINDINGS: The patient was unable to remain motionless for the exam. Small or subtle lesions could be overlooked.  No evidence for acute infarction, hemorrhage, mass lesion, hydrocephalus, or extra-axial fluid. Normal for age cerebral volume. Abnormal predominantly frontal LEFT greater than RIGHT periventricular white matter signal abnormality is atypical in distribution and premature for age. There are no subcortical, brainstem, or cerebellar white matter abnormalities. Considerations would include ischemic demyelination from complicated migraine, premature chronic microvascular ischemic change, chronic infection, demyelinating disease, or vasculitis. Flow voids are maintained, with no foci of chronic hemorrhage.  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 Correlate clinically for headaches related to pseudotumor cerebri. Slight tonsillar ectopia. Upper cervical region unremarkable. Extracranial soft tissues within normal limits.  Compared with CT head, the white matter changes are not visible.  IMPRESSION: Abnormal predominantly frontal, LEFT greater than RIGHT, periventricular white matter signal abnormality is atypical in distribution and premature for age. See discussion above. Ischemic demyelination from complicated migraine not excluded. Other considerations described  above.  Empty sella. Correlate clinically for relationship to the patient's headaches.   Electronically Signed   By: Davonna BellingJohn  Curnes M.D.   On: 08/22/2014 18:58   Mr Alexandria LodgeVenogram Head  08/22/2014   CLINICAL DATA:  Atypical headache, evaluate for venous thrombosis.  EXAM: MR VENOGRAM HEAD WITHOUT CONTRAST  TECHNIQUE: Angiographic images of the intracranial venous structures were obtained using MRV technique without intravenous contrast.  COMPARISON:  MRI brain reported separately.  CT head earlier today.  FINDINGS: The superior sagittal sinus is widely patent. The transverse sinuses and sigmoid sinuses are widely patent. Both internal jugular veins appear symmetric without visible thrombus.  The deep venous system is widely patent. Both internal cerebral veins are seen and supply a normal Great Vein of [place]. The straight sinus is unremarkable.  There is no visible asymmetry of cortical venous drainage.  IMPRESSION: Normal MR venogram of the intracranial circulation.   Electronically Signed   By: Davonna BellingJohn  Curnes M.D.   On: 08/22/2014 19:03     Mora BellmanHannah S Falisha Osment, PA-C 08/23/14 0025

## 2014-08-22 NOTE — ED Notes (Signed)
States headache started 2 weeks ago, on and off-- increasingly worse-- went to dr's office today and was sent here-- has been taking excedrin for migraines with some relief-- today's headache has caused vomiting x 5-- also states that face was numb when he awoke this am-- has been under "alot of stress the past few weeks" .   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

## 2014-08-22 NOTE — Consult Note (Signed)
NEURO HOSPITALIST CONSULT NOTE    Reason for Consult: migraine HA  HPI:                                                                                                                                          Michael Collier is an 33 y.o. male male with no history of migraine HA in past or in family.  Over the past two weeks he has had daily HA that last for about 20 minutes and resolve with Excedrin migraine. These previous HA have been associated with "sesation as though he is looking through water in peripheral fields (both), numbness of arms and legs (at times the right and other times the left) and lip tingling". This weekend he states he ran out of Excedrin.  He was HA free on Monday. This AM around 2 AM he awoke with HA and telling his wife his mouth felt "tingly and felt as though he could not swallow".  This passes but then later returned.  When returned she states he was was talking with garbled speech. Wife states he vomited 5 times. Currently  He has a 6/10 HA, located on the top of his head and described a stabbing in quality.  He states light does increase the pain. He has received Tylenol, Benadryl,Dilaudid,Compazine and Zofran with little benefit other than no further nausea.   History reviewed. No pertinent past medical history.  History reviewed. No pertinent past surgical history.  Family History  Problem Relation Age of Onset  . Hypertension Mother   . Hypertension Father      Social History:  reports that he has never smoked. He has never used smokeless tobacco. He reports that he drinks alcohol. He reports that he does not use illicit drugs.  No Known Allergies  MEDICATIONS:                                                                                                                     No current facility-administered medications for this encounter.   Current Outpatient Prescriptions  Medication Sig Dispense Refill  .  aspirin-acetaminophen-caffeine (EXCEDRIN MIGRAINE) 250-250-65 MG per tablet Take 1 tablet by mouth every 6 (six) hours as needed for headache.      . fexofenadine (ALLEGRA) 180 MG tablet  Take 180 mg by mouth daily.      Marland Kitchen HYDROcodone-acetaminophen (NORCO/VICODIN) 5-325 MG per tablet Take 1-2 tablets by mouth every 4 (four) hours as needed for severe pain. Take with food.  8 tablet  0      ROS:                                                                                                                                       History obtained from the patient  General ROS: negative for - chills, fatigue, fever, night sweats, weight gain or weight loss Psychological ROS: negative for - behavioral disorder, hallucinations, memory difficulties, mood swings or suicidal ideation Ophthalmic ROS: negative for - blurry vision, double vision, eye pain or loss of vision ENT ROS: negative for - epistaxis, nasal discharge, oral lesions, sore throat, tinnitus or vertigo Allergy and Immunology ROS: negative for - hives or itchy/watery eyes Hematological and Lymphatic ROS: negative for - bleeding problems, bruising or swollen lymph nodes Endocrine ROS: negative for - galactorrhea, hair pattern changes, polydipsia/polyuria or temperature intolerance Respiratory ROS: negative for - cough, hemoptysis, shortness of breath or wheezing Cardiovascular ROS: negative for - chest pain, dyspnea on exertion, edema or irregular heartbeat Gastrointestinal ROS: negative for - abdominal pain, diarrhea, hematemesis, nausea/vomiting or stool incontinence Genito-Urinary ROS: negative for - dysuria, hematuria, incontinence or urinary frequency/urgency Musculoskeletal ROS: negative for - joint swelling or muscular weakness Neurological ROS: as noted in HPI Dermatological ROS: negative for rash and skin lesion changes   Blood pressure 114/68, pulse 88, temperature 97.3 F (36.3 C), temperature source Oral, resp. rate 17,  height 5\' 10"  (1.778 m), weight 114.306 kg (252 lb), SpO2 99.00%.   Neurologic Examination:                                                                                                      General: NAD Mental Status: Alert, oriented, thought content appropriate.  Speech fluent without evidence of aphasia.  Able to follow 3 step commands without difficulty. Cranial Nerves: II: Discs flat bilaterally; Visual fields grossly normal, pupils equal, round, reactive to light and accommodation III,IV, VI: ptosis not present, extra-ocular motions intact bilaterally V,VII: smile symmetric, facial light touch sensation normal bilaterally VIII: hearing normal bilaterally IX,X: gag reflex present XI: bilateral shoulder shrug XII: midline tongue extension without atrophy or fasciculations  Motor: Right : Upper extremity   5/5    Left:     Upper extremity   5/5  Lower  extremity   5/5     Lower extremity   5/5 Tone and bulk:normal tone throughout; no atrophy noted Sensory: Pinprick and light touch intact throughout, bilaterally Deep Tendon Reflexes:  Right: Upper Extremity   Left: Upper extremity   biceps (C-5 to C-6) 2/4   biceps (C-5 to C-6) 2/4 tricep (C7) 2/4    triceps (C7) 2/4 Brachioradialis (C6) 2/4  Brachioradialis (C6) 2/4  Lower Extremity Lower Extremity  quadriceps (L-2 to L-4) 2/4   quadriceps (L-2 to L-4) 2/4 Achilles (S1) 2/4   Achilles (S1) 2/4  Plantars: Right: downgoing   Left: downgoing Cerebellar: normal finger-to-nose,  normal heel-to-shin test Gait: not tested due to multiple leads.  CV: pulses palpable throughout    Lab Results: Basic Metabolic Panel:  Recent Labs Lab 08/22/14 1210  NA 140  K 4.3  CL 103  CO2 24  GLUCOSE 107*  BUN 13  CREATININE 0.98  CALCIUM 9.5    Liver Function Tests: No results found for this basename: AST, ALT, ALKPHOS, BILITOT, PROT, ALBUMIN,  in the last 168 hours No results found for this basename: LIPASE, AMYLASE,  in the  last 168 hours No results found for this basename: AMMONIA,  in the last 168 hours  CBC:  Recent Labs Lab 08/22/14 1210  WBC 9.8  HGB 15.3  HCT 42.8  MCV 80.5  PLT 174    Cardiac Enzymes: No results found for this basename: CKTOTAL, CKMB, CKMBINDEX, TROPONINI,  in the last 168 hours  Lipid Panel: No results found for this basename: CHOL, TRIG, HDL, CHOLHDL, VLDL, LDLCALC,  in the last 168 hours  CBG: No results found for this basename: GLUCAP,  in the last 168 hours  Microbiology: Results for orders placed during the hospital encounter of 05/11/14  URINE CULTURE     Status: None   Collection Time    05/11/14  9:50 AM      Result Value Ref Range Status   Colony Count NO GROWTH   Final   Organism ID, Bacteria NO GROWTH   Final    Coagulation Studies: No results found for this basename: LABPROT, INR,  in the last 72 hours  Imaging: Ct Head Wo Contrast  08/22/2014   CLINICAL DATA:  Headache starting 2 weeks ago, vomiting x5 today, face numbness  EXAM: CT HEAD WITHOUT CONTRAST  TECHNIQUE: Contiguous axial images were obtained from the base of the skull through the vertex without intravenous contrast.  COMPARISON:  None  FINDINGS: No skull fracture is noted. Paranasal sinuses and mastoid air cells are unremarkable. No hydrocephalus. The gray and white-matter differentiation is preserved. No acute infarction. No mass lesion is noted on this unenhanced scan. No intra or extra-axial fluid collection. No hydrocephalus.  IMPRESSION: No acute intracranial abnormality.   Electronically Signed   By: Natasha MeadLiviu  Pop M.D.   On: 08/22/2014 12:39       Assessment and plan per attending neurologist  Michael Mornavid Smith PA-C [facility] [phone]  08/22/2014, 2:56 PM I have seen and evaluated the patient. I have reviewed the above note and made appropriate changes.    Assessment/Plan:  33 yo M with liekly complicated migraines. The alternating laterality of the symptoms, and  typical description of the headache as well as typical spread pattern(tingling climbs the body over 10 minutes) all argue for complicated migraine.   1) MRI brain, MRV 2) Depakote, magnesium, toradol.  3) Following this, would favor outpatient treatment with gabapentin 300mg  TID x 1 week if  there are still continued symptoms and imagign is negative.  4) Please call if imaging abnormalities are found.   Ritta SlotMcNeill Orine Goga, MD [facility] [phone]  If 7pm- 7am, please page neurology on call as listed in AMION.

## 2014-08-22 NOTE — ED Notes (Signed)
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

## 2014-08-24 ENCOUNTER — Ambulatory Visit (INDEPENDENT_AMBULATORY_CARE_PROVIDER_SITE_OTHER): Payer: 59 | Admitting: Neurology

## 2014-08-24 ENCOUNTER — Encounter: Payer: Self-pay | Admitting: Neurology

## 2014-08-24 VITALS — BP 120/87 | HR 72 | Ht 70.0 in | Wt 256.0 lb

## 2014-08-24 DIAGNOSIS — G43909 Migraine, unspecified, not intractable, without status migrainosus: Secondary | ICD-10-CM | POA: Insufficient documentation

## 2014-08-24 DIAGNOSIS — G43009 Migraine without aura, not intractable, without status migrainosus: Secondary | ICD-10-CM

## 2014-08-24 MED ORDER — NORTRIPTYLINE HCL 10 MG PO CAPS: ORAL_CAPSULE | ORAL | Status: DC

## 2014-08-24 MED ORDER — RIZATRIPTAN BENZOATE 10 MG PO TBDP: 10.0000 mg | ORAL_TABLET | ORAL | Status: DC | PRN

## 2014-08-24 NOTE — Progress Notes (Signed)
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 Leodis SiasFrancis Wong for evaluation of headaches  He presented to the emergency room in August 22 2014, woke up in the middle of the sleep, felt traveling numbness starting from his right leg, to his right arm, also involving his right face, lasting for 10 minutes, followed by a holocranial moderate headaches, he also had word finding difficulties, he has to woke up his wife, the headaches last about one day, had multiple nauseous vomiting episode  During his hospital stay, he was evaluated by neuro hospital this Dr. Amada JupiterKirkpatrick, MRI of the brain reviewed together with patient, there was mild ventricular white matter disease, most consistent with small vessel disease., He was diagnosed with complicated migraine, was given prescription of Neurontin 300 mg 3 times a day, tolerating it well, seems to help his symptoms some  Laboratory evaluation showed normal BMP, with the exception of mild elevated glucose 107, CBC,   Over the past 2 months, he had similar episodes, traveling numbness sometimes involving left side, followed by headaches, with light noise sensitivity, nauseous, there was no loss of consciousness, no seizure like event   REVIEW OF SYSTEMS: Full 14 system review of systems performed and notable only for headaches, numbness, slurred speech, dizziness, sleepiness  ALLERGIES: No Known Allergies  HOME MEDICATIONS: Current Outpatient Prescriptions on File Prior to Visit  Medication Sig Dispense Refill  . fexofenadine (ALLEGRA) 180 MG tablet Take 180 mg by mouth daily.      Marland Kitchen. gabapentin (NEURONTIN) 300 MG capsule Take 1 capsule (300 mg total) by mouth 3 (three) times daily.  21 capsule  0   No current facility-administered medications on file prior to visit.    PAST MEDICAL HISTORY: Past Medical History  Diagnosis Date  .  Migraine     PAST SURGICAL HISTORY: Past Surgical History  Procedure Laterality Date  . None      FAMILY HISTORY: Family History  Problem Relation Age of Onset  . Hypertension Mother   . Hypertension Father     SOCIAL HISTORY:  History   Social History  . Marital Status: Married    Spouse Name: Michael SimasJohanna    Number of Children: 2  . Years of Education: college   Occupational History  .  [employer]   Social History Main Topics  . Smoking status: Former Games developermoker  . Smokeless tobacco: Never Used     Comment: Quit 2014  . Alcohol Use: 0.5 oz/week    1 drink(s) per week     Comment: Social   . Drug Use: No  . Sexual Activity: Not on file   Other Topics Concern  . Not on file   Social History Narrative   Patient lives at home with his wife and children Michael Simas([name])   Patient works full time [employer].   Education some college.   Left handed.   Caffeine tea three or four when he is at work.     PHYSICAL EXAM   Filed Vitals:   08/24/14 1035  BP: 120/87  Pulse: 72  Height: 5\' 10"  (1.778 m)  Weight: 256 lb (116.121 kg)    Not recorded    Body mass index is 36.73 kg/(m^2).   Generalized: In no acute distress  Neck: Supple, no carotid bruits   Cardiac: Regular rate rhythm  Pulmonary: Clear to  auscultation bilaterally  Musculoskeletal: No deformity  Neurological examination  Mentation: Alert oriented to time, place, history taking, and causual conversation  Cranial nerve II-XII: Pupils were equal round reactive to light. Extraocular movements were full.  Visual field were full on confrontational test. Bilateral fundi were sharp.  Facial sensation and strength were normal. Hearing was intact to finger rubbing bilaterally. Uvula tongue midline.  Head turning and shoulder shrug and were normal and symmetric.Tongue protrusion into cheek strength was normal.  Motor: Normal tone, bulk and strength.  Sensory: Intact to  fine touch, pinprick, preserved vibratory sensation, and proprioception at toes.  Coordination: Normal finger to nose, heel-to-shin bilaterally there was no truncal ataxia  Gait: Rising up from seated position without assistance, normal stance, without trunk ataxia, moderate stride, good arm swing, smooth turning, able to perform tiptoe, and heel walking without difficulty.   Romberg signs: Negative  Deep tendon reflexes: Brachioradialis 2/2, biceps 2/2, triceps 2/2, patellar 2/2, Achilles 2/2, plantar responses were flexor bilaterally.   DIAGNOSTIC DATA (LABS, IMAGING, TESTING) - I reviewed patient records, labs, notes, testing and imaging myself where available.  Lab Results  Component Value Date   WBC 9.8 08/22/2014   HGB 15.3 08/22/2014   HCT 42.8 08/22/2014   MCV 80.5 08/22/2014   PLT 174 08/22/2014      Component Value Date/Time   NA 140 08/22/2014 1210   K 4.3 08/22/2014 1210   CL 103 08/22/2014 1210   CO2 24 08/22/2014 1210   GLUCOSE 107* 08/22/2014 1210   BUN 13 08/22/2014 1210   CREATININE 0.98 08/22/2014 1210   CREATININE 1.13 05/11/2014 0940   CALCIUM 9.5 08/22/2014 1210   PROT 7.5 05/11/2014 0940   ALBUMIN 4.8 05/11/2014 0940   AST 27 05/11/2014 0940   ALT 44 05/11/2014 0940   ALKPHOS 114 05/11/2014 0940   BILITOT 0.5 05/11/2014 0940   GFRNONAA >90 08/22/2014 1210   GFRAA >90 08/22/2014 1210   ASSESSMENT AND PLAN  Michael SarkWilliam Lozada is a 33 y.o. male complains of  traveling paresthesia, with associated headaches, most consistent with complicated migraines.  1, will try preventive medications nortriptyline, titrating to 20 mg every night 2. Maxalt as needed 3. Return to clinic in 3 weeks     Levert FeinsteinYijun Minal Stuller, M.D. Ph.D.  [facility] [address], [address] [place], [place] [address] [phone]

## 2014-08-27 ENCOUNTER — Encounter (HOSPITAL_COMMUNITY): Payer: Self-pay | Admitting: Emergency Medicine

## 2014-08-27 ENCOUNTER — Emergency Department (HOSPITAL_COMMUNITY)
Admission: EM | Admit: 2014-08-27 | Discharge: 2014-08-27 | Disposition: A | Discharge status: Home / Self Care | Payer: 59 | Attending: Emergency Medicine | Admitting: Emergency Medicine

## 2014-08-27 DIAGNOSIS — G43009 Migraine without aura, not intractable, without status migrainosus: Secondary | ICD-10-CM | POA: Insufficient documentation

## 2014-08-27 DIAGNOSIS — Z79899 Other long term (current) drug therapy: Secondary | ICD-10-CM | POA: Insufficient documentation

## 2014-08-27 DIAGNOSIS — Z87891 Personal history of nicotine dependence: Secondary | ICD-10-CM | POA: Diagnosis not present

## 2014-08-27 MED ORDER — KETOROLAC TROMETHAMINE 30 MG/ML IJ SOLN
30.0000 mg | Freq: Once | INTRAMUSCULAR | Status: AC
  Administered 2014-08-27: 30 mg via INTRAVENOUS
  Filled 2014-08-27: qty 1

## 2014-08-27 MED ORDER — ONDANSETRON HCL 4 MG/2ML IJ SOLN
4.0000 mg | Freq: Once | INTRAMUSCULAR | Status: DC
  Filled 2014-08-27: qty 2

## 2014-08-27 MED ORDER — HYDROMORPHONE HCL 1 MG/ML IJ SOLN
1.0000 mg | Freq: Once | INTRAMUSCULAR | Status: AC
  Administered 2014-08-27: 1 mg via INTRAVENOUS
  Filled 2014-08-27: qty 1

## 2014-08-27 MED ORDER — METOCLOPRAMIDE HCL 5 MG/ML IJ SOLN
10.0000 mg | Freq: Once | INTRAMUSCULAR | Status: AC
  Administered 2014-08-27: 10 mg via INTRAVENOUS
  Filled 2014-08-27: qty 2

## 2014-08-27 MED ORDER — DIPHENHYDRAMINE HCL 50 MG/ML IJ SOLN
25.0000 mg | Freq: Once | INTRAMUSCULAR | Status: AC
  Administered 2014-08-27: 25 mg via INTRAVENOUS
  Filled 2014-08-27: qty 1

## 2014-08-27 MED ORDER — SODIUM CHLORIDE 0.9 % IV BOLUS (SEPSIS)
1000.0000 mL | Freq: Once | INTRAVENOUS | Status: AC
  Administered 2014-08-27: 1000 mL via INTRAVENOUS

## 2014-08-27 NOTE — ED Provider Notes (Signed)
Medical screening examination/treatment/procedure(s) were conducted as a shared visit with non-physician practitioner(s) and myself.  I personally evaluated the patient during the encounter.   EKG Interpretation   Date/Time:  Tuesday August 22 2014 11:52:16 EDT Ventricular Rate:  78 PR Interval:  151 QRS Duration: 101 QT Interval:  365 QTC Calculation: 416 R Axis:   90 Text Interpretation:  Age not entered, assumed to be  [age] years old for  purpose of ECG interpretation Sinus rhythm Borderline right axis deviation  Low voltage, precordial leads ED PHYSICIAN INTERPRETATION AVAILABLE IN  [facility] Confirmed by TEST, Record ([id number]) on 08/24/2014 7:04:05 AM     33yM with HA. Suspect complicated migraine. Some neuro logical complaints but neuro exam is nonfocal. Will tx symptoms. CT neg. Concerning enough to obtain neurology consultation.   Raeford RazorStephen Aliannah Holstrom, MD 08/27/14 [phone]

## 2014-08-27 NOTE — ED Provider Notes (Signed)
CSN: [id number]     Arrival date & time 08/27/14  [id number] History   First MD Initiated Contact with Patient 08/27/14 [phone]     Chief Complaint  Patient presents with  . Migraine     (Consider location/radiation/quality/duration/timing/severity/associated sxs/prior Treatment) HPI Michael Collier is a 33 y.o. male with PMH of migraines presenting with headache that is frontal started this morning and gradually increased in intensity. This headache is like his more severe headaches but not worst of life. Patient takes neurotin and nortriptyline for migraines without much relief. Patient seen his neurologist four days ago and has appointment scheduled in four days. Neurologist say presentation is consistent with complicated migraines. Patient denies fevers, chills, neck stiffness, visual changes, emesis but some nausea, no numbness tingling, no weakness. Patient has had visual changes with prior headaches like looking through water but none today. Patient with head CT unremarkable and MRI 08/22/14 with "periventricular white matter signal abnormality is atypical in distribution and premature for age. There are no subcortical, brainstem, or cerebellar white matter abnormalities. Considerations would include ischemic demyelination from complicated migraine, premature chronic microvascular ischemic change, chronic infection, demyelinating disease, or vasculitis." Neurology consulted and recommend discharge with neurology follow up.    Past Medical History  Diagnosis Date  . Migraine    Past Surgical History  Procedure Laterality Date  . None     Family History  Problem Relation Age of Onset  . Hypertension Mother   . Hypertension Father    History  Substance Use Topics  . Smoking status: Former Games developermoker  . Smokeless tobacco: Never Used     Comment: Quit 2014  . Alcohol Use: 0.5 oz/week    1 drink(s) per week     Comment: Social     Review of Systems  Constitutional: Negative for fever and  chills.  HENT: Negative for congestion and rhinorrhea.   Eyes: Negative for visual disturbance.  Respiratory: Negative for cough and shortness of breath.   Cardiovascular: Negative for chest pain and palpitations.  Gastrointestinal: Positive for nausea. Negative for vomiting and diarrhea.  Genitourinary: Negative for dysuria and hematuria.  Musculoskeletal: Negative for back pain and gait problem.  Skin: Negative for rash.  Neurological: Positive for headaches. Negative for weakness.      Allergies  Review of patient's allergies indicates no known allergies.  Home Medications   Prior to Admission medications   Medication Sig Start Date End Date Taking? Authorizing Provider  cetirizine (ZYRTEC) 10 MG tablet Take 10 mg by mouth daily as needed for allergies.    Yes Historical Provider, MD  fexofenadine (ALLEGRA) 180 MG tablet Take 180 mg by mouth daily.   Yes Historical Provider, MD  gabapentin (NEURONTIN) 300 MG capsule Take 1 capsule (300 mg total) by mouth 3 (three) times daily. 08/22/14  Yes Mora BellmanHannah S Merrell, PA-C  nortriptyline (PAMELOR) 10 MG capsule Take 10-20 mg by mouth at bedtime. Take 10 mg by mouth at bedtime for 1 week, then take 20 mg by mouth at bedtime thereafter. 08/24/14  Yes Historical Provider, MD  rizatriptan (MAXALT-MLT) 10 MG disintegrating tablet Take 1 tablet (10 mg total) by mouth as needed. May repeat in 2 hours if needed 08/24/14  Yes Levert FeinsteinYijun Yan, MD   BP 138/73  Pulse 53  Temp(Src) 97.9 F (36.6 C) (Oral)  Resp 13  Ht 5\' 10"  (1.778 m)  Wt 254 lb (115.214 kg)  BMI 36.45 kg/m2  SpO2 100% Physical Exam  Nursing note and vitals reviewed. Constitutional:  He appears well-developed and well-nourished. No distress.  HENT:  Head: Normocephalic and atraumatic.  Mouth/Throat: Oropharynx is clear and moist.  Eyes: Conjunctivae and EOM are normal. Pupils are equal, round, and reactive to light. Right eye exhibits no discharge. Left eye exhibits no discharge.   Neck: Normal range of motion. Neck supple.  No nuchal rigidity  Cardiovascular: Normal rate and regular rhythm.   Pulmonary/Chest: Effort normal and breath sounds normal. No respiratory distress. He has no wheezes.  Abdominal: Soft. Bowel sounds are normal. He exhibits no distension. There is no tenderness.  Neurological: He is alert. No cranial nerve deficit. Coordination normal.  Strength 5/5 in upper and lower extremities. Sensation intact. Intact rapid alternating movements, finger to nose, and heel to shin. Negative Romberg. Normal gait.   Skin: Skin is warm and dry. He is not diaphoretic.    ED Course  Procedures (including critical care time) Labs Review Labs Reviewed - No data to display  Imaging Review No results found.   EKG Interpretation None      MDM   Final diagnoses:  Migraine without aura and without status migrainosus, not intractable   Patient recently diagnosed with complicated migraines by neurology with unremarkable CT and MRI with changes noted above. Pt HA treated and improved while in ED.  Presentation is like pt's typical HA, gradual in onset, not maximal in onset, and not worse of life. No visual or speech changes, no N/V, and no weakness. Pt is afebrile with no focal neuro deficits or nuchal rigidity. I doubt SAH, ICH, meningits or temporal arteritis. Patient with neurology follow up scheduled in four days. Pt verbalizes understanding and is agreeable with plan to dc.   Discussed return precautions with patient. Discussed all results and patient verbalizes understanding and agrees with plan.  Case has been discussed with  Dr. Anitra LauthPlunkett who agrees with the above plan and to discharge.       Louann SjogrenVictoria L Jaleea Alesi, PA-C 08/27/14 [id number]

## 2014-08-27 NOTE — Discharge Instructions (Signed)
Return to the emergency room with worsening of symptoms, new symptoms or with symptoms that are concerning, especially severe worsening of headache, visual or speech changes, weakness in face, arms or legs.  Follow up with your neurologist in four days.    Recurrent Migraine Headache A migraine headache is an intense, throbbing pain on one or both sides of your head. Recurrent migraines keep coming back. A migraine can last for 30 minutes to several hours. CAUSES  The exact cause of a migraine headache is not always known. However, a migraine may be caused when nerves in the brain become irritated and release chemicals that cause inflammation. This causes pain. Certain things may also trigger migraines, such as:   Alcohol.  Smoking.  Stress.  Menstruation.  Aged cheeses.  Foods or drinks that contain nitrates, glutamate, aspartame, or tyramine.  Lack of sleep.  Chocolate.  Caffeine.  Hunger.  Physical exertion.  Fatigue.  Medicines used to treat chest pain (nitroglycerine), birth control pills, estrogen, and some blood pressure medicines. SYMPTOMS   Pain on one or both sides of your head.  Pulsating or throbbing pain.  Severe pain that prevents daily activities.  Pain that is aggravated by any physical activity.  Nausea, vomiting, or both.  Dizziness.  Pain with exposure to bright lights, loud noises, or activity.  General sensitivity to bright lights, loud noises, or smells. Before you get a migraine, you may get warning signs that a migraine is coming (aura). An aura may include:  Seeing flashing lights.  Seeing bright spots, halos, or zigzag lines.  Having tunnel vision or blurred vision.  Having feelings of numbness or tingling.  Having trouble talking.  Having muscle weakness. DIAGNOSIS  A recurrent migraine headache is often diagnosed based on:  Symptoms.  Physical examination.  A CT scan or MRI of your head. These imaging tests cannot  diagnose migraines but can help rule out other causes of headaches.  TREATMENT  Medicines may be given for pain and nausea. Medicines can also be given to help prevent recurrent migraines. HOME CARE INSTRUCTIONS  Only take over-the-counter or prescription medicines for pain or discomfort as directed by your health care provider. The use of long-term narcotics is not recommended.  Lie down in a dark, quiet room when you have a migraine.  Keep a journal to find out what may trigger your migraine headaches. For example, write down:  What you eat and drink.  How much sleep you get.  Any change to your diet or medicines.  Limit alcohol consumption.  Quit smoking if you smoke.  Get 7-9 hours of sleep, or as recommended by your health care provider.  Limit stress.  Keep lights dim if bright lights bother you and make your migraines worse. SEEK MEDICAL CARE IF:   You do not get relief from the medicines given to you.  You have a recurrence of pain.  You have a fever. SEEK IMMEDIATE MEDICAL CARE IF:  Your migraine becomes severe.  You have a stiff neck.  You have loss of vision.  You have muscular weakness or loss of muscle control.  You start losing your balance or have trouble walking.  You feel faint or pass out.  You have severe symptoms that are different from your first symptoms. MAKE SURE YOU:   Understand these instructions.  Will watch your condition.  Will get help right away if you are not doing well or get worse. Document Released: 07/22/2001 Document Revised: 03/13/2014 Document Reviewed: 07/04/2013  ExitCare Patient Information 2015 [employer] [place]. This information is not intended to replace advice given to you by your health care provider. Make sure you discuss any questions you have with your health care provider.

## 2014-08-27 NOTE — ED Notes (Signed)
PA at bedside.

## 2014-08-27 NOTE — ED Notes (Signed)
Pt alert and oriented at discharge.  Pt taken home by family at bedside.

## 2014-08-29 NOTE — ED Provider Notes (Signed)
Medical screening examination/treatment/procedure(s) were performed by non-physician practitioner and as supervising physician I was immediately available for consultation/collaboration.   EKG Interpretation None        Calistro Rauf, MD 08/29/14 2011 

## 2014-08-30 ENCOUNTER — Telehealth: Payer: Self-pay | Admitting: Neurology

## 2014-08-30 MED ORDER — GABAPENTIN 300 MG PO CAPS: 300.0000 mg | ORAL_CAPSULE | Freq: Three times a day (TID) | ORAL | Status: DC

## 2014-08-30 NOTE — Telephone Encounter (Signed)
Patient would like to know if we can prescribe Gabapentin (originally written at ED), if they are to continue medication.  Please advise.  Thank you.

## 2014-08-30 NOTE — ED Provider Notes (Signed)
Medical screening examination/treatment/procedure(s) were performed by non-physician practitioner and as supervising physician I was immediately available for consultation/collaboration.   EKG Interpretation   Date/Time:  Tuesday August 22 2014 11:52:16 EDT Ventricular Rate:  78 PR Interval:  151 QRS Duration: 101 QT Interval:  365 QTC Calculation: 416 R Axis:   90 Text Interpretation:  Age not entered, assumed to be  33 years old for  purpose of ECG interpretation Sinus rhythm Borderline right axis deviation  Low voltage, precordial leads ED PHYSICIAN INTERPRETATION AVAILABLE IN  [facility] Confirmed by TEST, Record ([id number]) on 08/24/2014 7:04:05 AM        Rolland PorterMark Deari Sessler, MD 08/30/14 2309

## 2014-08-30 NOTE — Telephone Encounter (Signed)
I have called patient, he is taking neurontin 300mg  tid, also nortriptyline 10mg  qhs, tolerating it well.  I have called in Neurontin 300mg  tid

## 2014-08-30 NOTE — Telephone Encounter (Signed)
Patient requesting Rx refill of gabapentin (NEURONTIN) 300 MG capsule.  Please call and advise.

## 2014-08-31 ENCOUNTER — Ambulatory Visit (INDEPENDENT_AMBULATORY_CARE_PROVIDER_SITE_OTHER): Payer: 59 | Admitting: Radiology

## 2014-08-31 DIAGNOSIS — G43009 Migraine without aura, not intractable, without status migrainosus: Secondary | ICD-10-CM

## 2014-09-01 NOTE — Procedures (Signed)
   HISTORY: 33 years old male, presenting with his traveling paresthesia with associated headaches,   TECHNIQUE:  16 channel EEG was performed based on standard 10-16 international system. One channel was dedicated to EKG, which has demonstrates normal sinus rhythm of 60 beats per minutes.  Upon awakening, the posterior background activity was well-developed, in alpha range,reactive to eye opening and closure.  There was no evidence of epileptiform discharge.  Photic stimulation was performed, which induced a symmetric photic driving.  Hyperventilation was performed, there was no abnormality elicit.  Stage II sleep was achieved as evident by K complexes and sleep spindles  CONCLUSION: This is a  normal awake and asleep and EEG.  There is no electrodiagnostic evidence of epileptiform discharge

## 2014-09-04 ENCOUNTER — Telehealth: Payer: Self-pay | Admitting: Neurology

## 2014-09-04 NOTE — Telephone Encounter (Signed)
Patient calling to request sooner appointment due to headaches happening more frequently, please return call and advise.

## 2014-09-04 NOTE — Telephone Encounter (Signed)
Called patient back and left voice mail to call office back for a sooner appt. If Dr.Yan has any opening this week please put patient in a slot no matter what kind of slot thanks Annabelle Harmanana.

## 2014-09-05 ENCOUNTER — Telehealth: Payer: Self-pay

## 2014-09-05 DIAGNOSIS — G43009 Migraine without aura, not intractable, without status migrainosus: Secondary | ICD-10-CM

## 2014-09-05 DIAGNOSIS — R202 Paresthesia of skin: Secondary | ICD-10-CM

## 2014-09-05 NOTE — Telephone Encounter (Signed)
Re- order EEG. 

## 2014-09-07 ENCOUNTER — Encounter: Payer: Self-pay | Admitting: Neurology

## 2014-09-07 ENCOUNTER — Ambulatory Visit (INDEPENDENT_AMBULATORY_CARE_PROVIDER_SITE_OTHER): Payer: 59 | Admitting: Neurology

## 2014-09-07 VITALS — BP 111/83 | HR 94 | Ht 70.0 in | Wt 250.0 lb

## 2014-09-07 DIAGNOSIS — G43009 Migraine without aura, not intractable, without status migrainosus: Secondary | ICD-10-CM

## 2014-09-07 MED ORDER — SUMATRIPTAN SUCCINATE 6 MG/0.5ML ~~LOC~~ SOLN: 6.0000 mg | SUBCUTANEOUS | Status: DC | PRN

## 2014-09-07 MED ORDER — NORTRIPTYLINE HCL 25 MG PO CAPS: ORAL_CAPSULE | ORAL | Status: DC

## 2014-09-07 NOTE — Patient Instructions (Signed)
Magnesium oxide 400mg twice a day Riboflavin 100mg  

## 2014-09-07 NOTE — Telephone Encounter (Signed)
Patient is seeing Dr.Yan today.

## 2014-09-07 NOTE — Progress Notes (Signed)
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 Leodis SiasFrancis Wong for evaluation of headaches  He presented to the emergency room in August 22 2014, woke up in the middle of the sleep, felt traveling numbness starting from his right leg, to his right arm, also involving his right face, lasting for 10 minutes, followed by a holocranial moderate headaches, he also had word finding difficulties, he has to woke up his wife, the headaches last about one day, had multiple nauseous vomiting episode  During his hospital stay, he was evaluated by neuro hospital this Dr. Amada JupiterKirkpatrick, MRI of the brain reviewed together with patient, there was mild ventricular white matter disease, most consistent with small vessel disease., He was diagnosed with complicated migraine, was given prescription of Neurontin 300 mg 3 times a day, tolerating it well, seems to help his symptoms some  Laboratory evaluation showed normal BMP, with the exception of mild elevated glucose 107, CBC,   Over the past 2 months, he had similar episodes, traveling numbness sometimes involving left side, followed by headaches, with light noise sensitivity, nauseous, there was no loss of consciousness, no seizure like event  UPDATE Oct 29th 2015:  He still has headaches, went back to ED in October 18, for recurrent headaches, improved after migraine cocktail, reported headache in October 20, 20 first, 20 second, 20 fourth, 26, moderate only headaches, with associated light noise sensitivity, nauseous, sometimes with dizziness,  He complains excessive stress at work,  He is taking and tolerating Neurontin 300 mg 3 times a day, nortriptyline 10 mg 2 tablets every night  Maxalt did not help his headaches  REVIEW OF SYSTEMS: Full 14 system review of systems performed and notable only for headaches, blurry vision dizziness, nausea  vomiting, neck pain  ALLERGIES: No Known Allergies  HOME MEDICATIONS: Current Outpatient Prescriptions on File Prior to Visit  Medication Sig Dispense Refill  . cetirizine (ZYRTEC) 10 MG tablet Take 10 mg by mouth daily as needed for allergies.       . fexofenadine (ALLEGRA) 180 MG tablet Take 180 mg by mouth daily.      Marland Kitchen. gabapentin (NEURONTIN) 300 MG capsule Take 1 capsule (300 mg total) by mouth 3 (three) times daily.  90 capsule  11  . nortriptyline (PAMELOR) 10 MG capsule Take 10-20 mg by mouth at bedtime. Take 10 mg by mouth at bedtime for 1 week, then take 20 mg by mouth at bedtime thereafter.      . rizatriptan (MAXALT-MLT) 10 MG disintegrating tablet Take 1 tablet (10 mg total) by mouth as needed. May repeat in 2 hours if needed  15 tablet  6   No current facility-administered medications on file prior to visit.    PAST MEDICAL HISTORY: Past Medical History  Diagnosis Date  . Migraine     PAST SURGICAL HISTORY: Past Surgical History  Procedure Laterality Date  . None      FAMILY HISTORY: Family History  Problem Relation Age of Onset  . Hypertension Mother   . Hypertension Father     SOCIAL HISTORY:  History   Social History  . Marital Status: Married    Spouse Name: Michael SimasJohanna    Number of Children: 2  . Years of Education: college   Occupational History  .  [employer]   Social History Main Topics  . Smoking status: Former Smoker  Start date: 08/10/2004  . Smokeless tobacco: Former NeurosurgeonUser    Types: Snuff    Quit date: 10/10/2013     Comment: Quit 2014  . Alcohol Use: 0.5 oz/week    1 drink(s) per week     Comment: Social   . Drug Use: No  . Sexual Activity: Not on file   Other Topics Concern  . Not on file   Social History Narrative   Patient lives at home with his wife and children Michael Collier([name])   Patient works full time [employer].   Education some college.   Left handed.   Caffeine tea three or four when  he is at work. Un sweet tea.     PHYSICAL EXAM   Filed Vitals:   09/07/14 1422  BP: 111/83  Pulse: 94  Height: 5\' 10"  (1.778 m)  Weight: 250 lb (113.399 kg)    Not recorded    Body mass index is 35.87 kg/(m^2).   Generalized: In no acute distress  Neck: Supple, no carotid bruits   Cardiac: Regular rate rhythm  Pulmonary: Clear to auscultation bilaterally  Musculoskeletal: No deformity  Neurological examination  Mentation: Alert oriented to time, place, history taking, and causual conversation  Cranial nerve II-XII: Pupils were equal round reactive to light. Extraocular movements were full.  Visual field were full on confrontational test. Bilateral fundi were sharp.  Facial sensation and strength were normal. Hearing was intact to finger rubbing bilaterally. Uvula tongue midline.  Head turning and shoulder shrug and were normal and symmetric.Tongue protrusion into cheek strength was normal.  Motor: Normal tone, bulk and strength.  Sensory: Intact to fine touch, pinprick, preserved vibratory sensation, and proprioception at toes.  Coordination: Normal finger to nose, heel-to-shin bilaterally there was no truncal ataxia  Gait: Rising up from seated position without assistance, normal stance, without trunk ataxia, moderate stride, good arm swing, smooth turning, able to perform tiptoe, and heel walking without difficulty.   Romberg signs: Negative  Deep tendon reflexes: Brachioradialis 2/2, biceps 2/2, triceps 2/2, patellar 2/2, Achilles 2/2, plantar responses were flexor bilaterally.   DIAGNOSTIC DATA (LABS, IMAGING, TESTING) - I reviewed patient records, labs, notes, testing and imaging myself where available.  Lab Results  Component Value Date   WBC 9.8 08/22/2014   HGB 15.3 08/22/2014   HCT 42.8 08/22/2014   MCV 80.5 08/22/2014   PLT 174 08/22/2014      Component Value Date/Time   NA 140 08/22/2014 1210   K 4.3 08/22/2014 1210   CL 103 08/22/2014 1210    CO2 24 08/22/2014 1210   GLUCOSE 107* 08/22/2014 1210   BUN 13 08/22/2014 1210   CREATININE 0.98 08/22/2014 1210   CREATININE 1.13 05/11/2014 0940   CALCIUM 9.5 08/22/2014 1210   PROT 7.5 05/11/2014 0940   ALBUMIN 4.8 05/11/2014 0940   AST 27 05/11/2014 0940   ALT 44 05/11/2014 0940   ALKPHOS 114 05/11/2014 0940   BILITOT 0.5 05/11/2014 0940   GFRNONAA >90 08/22/2014 1210   GFRAA >90 08/22/2014 1210   ASSESSMENT AND PLAN  Michael Collier Parsell is a 33 y.o. male complains of  traveling paresthesia, with associated headaches, most consistent with complicated migraines.  Titrating nortriptyline to 50 mg every night May consider over-the-counter magnesium oxide, riboflavin twice a day Imitrex injection as needed Return to clinic in 2-3 weeks, if he continues to have frequent headaches, may consider a steroid package   Levert FeinsteinYijun Lavelle Berland, M.D. Ph.D.  [facility]  [place], [address], [place] [address] [phone]

## 2014-09-14 ENCOUNTER — Ambulatory Visit: Payer: 59 | Admitting: Neurology

## 2014-09-26 ENCOUNTER — Ambulatory Visit (INDEPENDENT_AMBULATORY_CARE_PROVIDER_SITE_OTHER): Payer: 59 | Admitting: Neurology

## 2014-09-26 ENCOUNTER — Encounter: Payer: Self-pay | Admitting: Neurology

## 2014-09-26 VITALS — BP 117/81 | HR 95 | Ht 70.0 in | Wt 254.0 lb

## 2014-09-26 DIAGNOSIS — G43009 Migraine without aura, not intractable, without status migrainosus: Secondary | ICD-10-CM

## 2014-09-26 MED ORDER — GABAPENTIN 300 MG PO CAPS
300.0000 mg | ORAL_CAPSULE | Freq: Three times a day (TID) | ORAL | Status: DC
Start: 2014-09-26 — End: 2014-12-21

## 2014-09-26 MED ORDER — SUMATRIPTAN SUCCINATE 100 MG PO TABS: 100.0000 mg | ORAL_TABLET | ORAL | Status: DC | PRN

## 2014-09-26 NOTE — Progress Notes (Signed)
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 Leodis Sias for evaluation of headaches  He presented to the emergency room in August 22 2014, woke up in the middle of the sleep, felt traveling numbness starting from his right leg, to his right arm, also involving his right face, lasting for 10 minutes, followed by a holocranial moderate headaches, he also had word finding difficulties, he has to woke up his wife, the headaches last about one day, had multiple nauseous vomiting episode  During his hospital stay, he was evaluated by neuro hospital this Dr. Amada Jupiter, MRI of the brain reviewed together with patient, there was mild ventricular white matter disease, most consistent with small vessel disease., He was diagnosed with complicated migraine, was given prescription of Neurontin 300 mg 3 times a day, tolerating it well, seems to help his symptoms some  Laboratory evaluation showed normal BMP, with the exception of mild elevated glucose 107, CBC,   Over the past 2 months, he had similar episodes, traveling numbness sometimes involving left side, followed by headaches, with light noise sensitivity, nauseous, there was no loss of consciousness, no seizure like event  UPDATE Oct 29th 2015:  He still has headaches, went back to ED in October 18, for recurrent headaches, improved after migraine cocktail, reported headache in October 20, 20 first, 20 second, 20 fourth, 26, moderate only headaches, with associated light noise sensitivity, nauseous, sometimes with dizziness,  He complains excessive stress at work,  He is taking and tolerating Neurontin 300 mg 3 times a day, nortriptyline 10 mg 2 tablets every night  Maxalt did not help his headaches  UPDATE Nov 17th 2015: He reported 90% improvement of his headaches, he only has mild bilateral frontal headaches, did not try  triptan, he is now taking nortriptyline 25mg  2 tabs po qhs, magnesium oxide, and riboflavin, gabapentin 300 mg 3 times a day, denies significant side effect  REVIEW OF SYSTEMS: Full 14 system review of systems performed and notable only for as above  ALLERGIES: No Known Allergies  HOME MEDICATIONS: Current Outpatient Prescriptions on File Prior to Visit  Medication Sig Dispense Refill  . cetirizine (ZYRTEC) 10 MG tablet Take 10 mg by mouth daily as needed for allergies.     . fexofenadine (ALLEGRA) 180 MG tablet Take 180 mg by mouth daily.    Marland Kitchen gabapentin (NEURONTIN) 300 MG capsule Take 1 capsule (300 mg total) by mouth 3 (three) times daily. 90 capsule 11  . IBUPROFEN PO Take by mouth as needed.    . nortriptyline (PAMELOR) 25 MG capsule 2 tabs po qhs 60 capsule 11  . rizatriptan (MAXALT-MLT) 10 MG disintegrating tablet Take 1 tablet (10 mg total) by mouth as needed. May repeat in 2 hours if needed 15 tablet 6  . SUMAtriptan (IMITREX) 6 MG/0.5ML SOLN injection Inject 0.5 mLs (6 mg total) into the skin every 2 (two) hours as needed for migraine or headache. May repeat in 2 hours if headache persists or recurs. 5 mL 11   No current facility-administered medications on file prior to visit.    PAST MEDICAL HISTORY: Past Medical History  Diagnosis Date  . Migraine     PAST SURGICAL HISTORY: Past Surgical History  Procedure Laterality Date  . None      FAMILY HISTORY: Family History  Problem Relation Age of Onset  . Hypertension Mother   . Hypertension  Father     SOCIAL HISTORY:  History   Social History  . Marital Status: Married    Spouse Name: Loistine SimasJohanna    Number of Children: 2  . Years of Education: college   Occupational History  .  [employer]   Social History Main Topics  . Smoking status: Former Smoker    Start date: 08/10/2004  . Smokeless tobacco: Former NeurosurgeonUser    Types: Snuff    Quit date: 10/10/2013     Comment: Quit 2014  . Alcohol Use: 0.5  oz/week    1 drink(s) per week     Comment: Social   . Drug Use: No  . Sexual Activity: Not on file   Other Topics Concern  . Not on file   Social History Narrative   Patient lives at home with his wife and children [name]   Patient works full time for [employer].   Education some college.   Left handed.   Caffeine tea three or four when he is at work. Un sweet tea.     PHYSICAL EXAM   Filed Vitals:   09/26/14 1036  BP: 117/81  Pulse: 95  Height: 5\' 10"  (1.778 m)  Weight: 254 lb (115.214 kg)    Not recorded      Body mass index is 36.45 kg/(m^2).   Generalized: In no acute distress  Neck: Supple, no carotid bruits   Cardiac: Regular rate rhythm  Pulmonary: Clear to auscultation bilaterally  Musculoskeletal: No deformity  Neurological examination  Mentation: Alert oriented to time, place, history taking, and causual conversation  Cranial nerve II-XII: Pupils were equal round reactive to light. Extraocular movements were full.  Visual field were full on confrontational test. Bilateral fundi were sharp.  Facial sensation and strength were normal. Hearing was intact to finger rubbing bilaterally. Uvula tongue midline.  Head turning and shoulder shrug and were normal and symmetric.Tongue protrusion into cheek strength was normal.  Motor: Normal tone, bulk and strength.  Sensory: Intact to fine touch, pinprick, preserved vibratory sensation, and proprioception at toes.  Coordination: Normal finger to nose, heel-to-shin bilaterally there was no truncal ataxia  Gait: Rising up from seated position without assistance, normal stance, without trunk ataxia, moderate stride, good arm swing, smooth turning, able to perform tiptoe, and heel walking without difficulty.   Romberg signs: Negative  Deep tendon reflexes: Brachioradialis 2/2, biceps 2/2, triceps 2/2, patellar 2/2, Achilles 2/2, plantar responses were flexor  bilaterally.   DIAGNOSTIC DATA (LABS, IMAGING, TESTING) - I reviewed patient records, labs, notes, testing and imaging myself where available.  Lab Results  Component Value Date   WBC 9.8 08/22/2014   HGB 15.3 08/22/2014   HCT 42.8 08/22/2014   MCV 80.5 08/22/2014   PLT 174 08/22/2014      Component Value Date/Time   NA 140 08/22/2014 1210   K 4.3 08/22/2014 1210   CL 103 08/22/2014 1210   CO2 24 08/22/2014 1210   GLUCOSE 107* 08/22/2014 1210   BUN 13 08/22/2014 1210   CREATININE 0.98 08/22/2014 1210   CREATININE 1.13 05/11/2014 0940   CALCIUM 9.5 08/22/2014 1210   PROT 7.5 05/11/2014 0940   ALBUMIN 4.8 05/11/2014 0940   AST 27 05/11/2014 0940   ALT 44 05/11/2014 0940   ALKPHOS 114 05/11/2014 0940   BILITOT 0.5 05/11/2014 0940   GFRNONAA >90 08/22/2014 1210   GFRAA >90 08/22/2014 1210   ASSESSMENT AND PLAN  Satira SarkWilliam Martindelcampo is a  33 y.o. male complains of  traveling paresthesia, with associated headaches, most consistent with complicated migraines.  continue nortriptyline to 50 mg every night, gabapentin 300mg  tid and  magnesium oxide, riboflavin twice a day Imitrex 100mg  as needed Return to clinic in 2-3 months with NP   Levert FeinsteinYijun Hansini Clodfelter, M.D. Ph.D.  [facility] [address], [address] [place], [place] [id number] [phone]

## 2014-12-06 ENCOUNTER — Encounter: Payer: Self-pay | Admitting: Adult Health

## 2014-12-06 ENCOUNTER — Ambulatory Visit (INDEPENDENT_AMBULATORY_CARE_PROVIDER_SITE_OTHER): Payer: 59 | Admitting: Adult Health

## 2014-12-06 VITALS — BP 120/73 | HR 90 | Ht 70.0 in | Wt 246.5 lb

## 2014-12-06 DIAGNOSIS — G43009 Migraine without aura, not intractable, without status migrainosus: Secondary | ICD-10-CM

## 2014-12-06 NOTE — Progress Notes (Signed)
I have reviewed and agreed above plan. 

## 2014-12-06 NOTE — Progress Notes (Signed)
PATIENT: Michael Collier DOB: 03/26/1981  REASON FOR VISIT: follow up- migraines.  HISTORY FROM: patient  HISTORY OF PRESENT ILLNESS: Michael Collier is a 34 year old male with a history of migraine headaches. He returns today for follow-up. He is currently taking nortriptyline and gabapentin. He reports that his headaches have improved. He states that he has not had a headache since the last visit.  He has forgotten to take his medication and it has not caused him to have a headache. He is a Emergency planning/management officerpolice officer. No new medical history since last seen. The patient would like to try to start weaning off some of his medication.   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 Leodis SiasFrancis Wong for evaluation of headaches. He presented to the emergency room in August 22 2014, woke up in the middle of the sleep, felt traveling numbness starting from his right leg, to his right arm, also involving his right face, lasting for 10 minutes, followed by a holocranial moderate headaches, he also had word finding difficulties, he has to woke up his wife, the headaches last about one day, had multiple nauseous vomiting episode During his hospital stay, he was evaluated by neuro hospital this Dr. Amada JupiterKirkpatrick, MRI of the brain reviewed together with patient, there was mild ventricular white matter disease, most consistent with small vessel disease., He was diagnosed with complicated migraine, was given prescription of Neurontin 300 mg 3 times a day, tolerating it well, seems to help his symptoms some Laboratory evaluation showed normal BMP, with the exception of mild elevated glucose 107, CBC,Over the past 2 months, he had similar episodes, traveling numbness sometimes involving left side, followed by headaches, with light noise sensitivity, nauseous, there was no loss of consciousness, no seizure like event UPDATE Oct 29th 2015:He still has  headaches, went back to ED in October 18, for recurrent headaches, improved after migraine cocktail, reported headache in October 20, 20 first, 20 second, 20 fourth, 26, moderate only headaches, with associated light noise sensitivity, nauseous, sometimes with dizziness, He complains excessive stress at work, He is taking and tolerating Neurontin 300 mg 3 times a day, nortriptyline 10 mg 2 tablets every night  Maxalt did not help his headaches UPDATE Nov 17th 2015:He reported 90% improvement of his headaches, he only has mild bilateral frontal headaches, did not try triptan, he is now taking nortriptyline 25mg  2 tabs po qhs, magnesium oxide, and riboflavin, gabapentin 300 mg 3 times a day, denies significant side effect   REVIEW OF SYSTEMS: Out of a complete 14 system review of symptoms, the patient complains only of the following symptoms, and all other reviewed systems are negative.  ALLERGIES: No Known Allergies  HOME MEDICATIONS: Outpatient Prescriptions Prior to Visit  Medication Sig Dispense Refill  . cetirizine (ZYRTEC) 10 MG tablet Take 10 mg by mouth daily as needed for allergies.     . fexofenadine (ALLEGRA) 180 MG tablet Take 180 mg by mouth daily.    Marland Kitchen. gabapentin (NEURONTIN) 300 MG capsule Take 1 capsule (300 mg total) by mouth 3 (three) times daily. 90 capsule 11  . IBUPROFEN PO Take by mouth as needed.    . nortriptyline (PAMELOR) 10 MG capsule     . nortriptyline (PAMELOR) 25 MG capsule 2 tabs po qhs 60 capsule 11  . SUMAtriptan (IMITREX) 100 MG tablet Take 1 tablet (100 mg total) by mouth every 2 (two) hours as needed for  migraine or headache. May repeat in 2 hours if headache persists or recurs. (Patient not taking: Reported on 12/06/2014) 10 tablet 6   No facility-administered medications prior to visit.    PAST MEDICAL HISTORY: Past Medical History  Diagnosis Date  . Migraine     PAST SURGICAL HISTORY: Past Surgical History  Procedure Laterality Date  . None       FAMILY HISTORY: Family History  Problem Relation Age of Onset  . Hypertension Mother   . Hypertension Father     SOCIAL HISTORY:   PHYSICAL EXAM  Filed Vitals:   12/06/14 0920  BP: 120/73  Pulse: 90  Height:  (1.778 m)  Weight: 246 lb 8 oz (111.812 kg)   Body mass index is 35.37 kg/(m^2).  Generalized: Well developed, in no acute distress   Neurological examination  Mentation: Alert oriented to time, place, history taking. Follows all commands speech and language fluent Cranial nerve II-XII: Pupils were equal round reactive to light. Extraocular movements were full, visual field were full on confrontational test. Facial sensation and strength were normal. Uvula tongue midline. Head turning and shoulder shrug  were normal and symmetric. Motor: The motor testing reveals 5 over 5 strength of all 4 extremities. Good symmetric motor tone is noted throughout.  Sensory: Sensory testing is intact to soft touch on all 4 extremities. No evidence of extinction is noted.  Coordination: Cerebellar testing reveals good finger-nose-finger and heel-to-shin bilaterally.  Gait and station: Gait is normal. Tandem gait is normal. Romberg is negative. No drift is seen.  Reflexes: Deep tendon reflexes are symmetric and normal bilaterally.     DIAGNOSTIC DATA (LABS, IMAGING, TESTING) - I reviewed patient records, labs, notes, testing and imaging myself where available.  Lab Results  Component Value Date   WBC 9.8 08/22/2014   HGB 15.3 08/22/2014   HCT 42.8 08/22/2014   MCV 80.5 08/22/2014   PLT 174 08/22/2014      Component Value Date/Time   NA 140 08/22/2014 1210   K 4.3 08/22/2014 1210   CL 103 08/22/2014 1210   CO2 24 08/22/2014 1210   GLUCOSE 107* 08/22/2014 1210   BUN 13 08/22/2014 1210   CREATININE 0.98 08/22/2014 1210   CREATININE 1.13 05/11/2014 0940   CALCIUM 9.5 08/22/2014 1210   PROT 7.5 05/11/2014 0940   ALBUMIN 4.8 05/11/2014 0940   AST 27 05/11/2014 0940    ALT 44 05/11/2014 0940   ALKPHOS 114 05/11/2014 0940   BILITOT 0.5 05/11/2014 0940   GFRNONAA >90 08/22/2014 1210   GFRAA >90 08/22/2014 1210      ASSESSMENT AND PLAN 34 y.o. year old male  has a past medical history of Migraine. here with:  1. Migraine headaches  The patient is doing well. No migraine headaches since the last visit. He would like to try to wean off some of his medication. I instructed the patient to reduce his gabapentin dose to 1 tablet BID for 1 week, then 1 tablet daily for 1 week then stop. If his headaches return he should let us know. He will continue taking Nortriptyline 50 mg at bedtime. Patient verbalized understanding. He will return in 6 months or sooner if needed.   Butch Penny, MSN, NP-C 12/06/2014, 10:00 AM [facility] [address] [place], [place] [address] [phone]  Note: This document was prepared with digital dictation and possible smart phrase technology. Any transcriptional errors that result from this process are unintentional.

## 2014-12-06 NOTE — Patient Instructions (Signed)
We will wean you off the gabapentin- take 1 table BID for one week, then 1 tablet daily for week then stop.  If your headaches return please let us know.  Continue the nortriptyline 50 mg at bedtime. If your symptoms worsen or you develop new symptoms let us know.

## 2014-12-18 ENCOUNTER — Telehealth: Payer: Self-pay | Admitting: *Deleted

## 2014-12-18 NOTE — Telephone Encounter (Signed)
[facility] [place] desk.

## 2014-12-19 DIAGNOSIS — Z0289 Encounter for other administrative examinations: Secondary | ICD-10-CM

## 2014-12-21 ENCOUNTER — Encounter: Payer: Self-pay | Admitting: *Deleted

## 2014-12-22 ENCOUNTER — Telehealth: Payer: Self-pay | Admitting: *Deleted

## 2014-12-22 NOTE — Telephone Encounter (Signed)
Call patient on 12/22/14 his form is at the front desk.

## 2015-06-15 ENCOUNTER — Ambulatory Visit: Payer: 59 | Admitting: Adult Health

## 2017-02-01 DIAGNOSIS — S30860A Insect bite (nonvenomous) of lower back and pelvis, initial encounter: Secondary | ICD-10-CM | POA: Diagnosis not present

## 2017-04-22 NOTE — Telephone Encounter (Signed)
Is this the wrong pt? DOB [birth year]? Michael Collier?

## 2017-04-22 NOTE — Telephone Encounter (Signed)
Misty StanleyLisa @ [facility] called to check on paperwork she had faxed over. She needs forms completed and office notes.   Paty/Misty - have you seen these? Thanks!

## 2017-04-22 NOTE — Progress Notes (Signed)
ENCOUNTER CREATED IN ERROR

## 2017-06-23 DIAGNOSIS — J3489 Other specified disorders of nose and nasal sinuses: Secondary | ICD-10-CM | POA: Diagnosis not present

## 2017-06-23 DIAGNOSIS — R05 Cough: Secondary | ICD-10-CM | POA: Diagnosis not present

## 2017-12-02 ENCOUNTER — Other Ambulatory Visit: Payer: Self-pay | Admitting: Occupational Medicine

## 2017-12-02 ENCOUNTER — Ambulatory Visit
Admission: RE | Admit: 2017-12-02 | Discharge: 2017-12-02 | Disposition: A | Discharge status: Home / Self Care | Payer: No Typology Code available for payment source | Source: Ambulatory Visit | Attending: Occupational Medicine | Admitting: Occupational Medicine

## 2017-12-02 DIAGNOSIS — Z021 Encounter for pre-employment examination: Secondary | ICD-10-CM

## 2018-03-31 DIAGNOSIS — Z0000 Encounter for general adult medical examination without abnormal findings: Secondary | ICD-10-CM | POA: Diagnosis not present

## 2018-03-31 DIAGNOSIS — E781 Pure hyperglyceridemia: Secondary | ICD-10-CM | POA: Diagnosis not present

## 2018-05-10 DIAGNOSIS — M9904 Segmental and somatic dysfunction of sacral region: Secondary | ICD-10-CM | POA: Diagnosis not present

## 2018-05-10 DIAGNOSIS — M9903 Segmental and somatic dysfunction of lumbar region: Secondary | ICD-10-CM | POA: Diagnosis not present

## 2018-05-10 DIAGNOSIS — M9905 Segmental and somatic dysfunction of pelvic region: Secondary | ICD-10-CM | POA: Diagnosis not present

## 2018-05-12 ENCOUNTER — Encounter: Payer: Self-pay | Admitting: Sports Medicine

## 2018-05-12 ENCOUNTER — Ambulatory Visit: Payer: 59 | Admitting: Sports Medicine

## 2018-05-12 DIAGNOSIS — M5136 Other intervertebral disc degeneration, lumbar region: Secondary | ICD-10-CM | POA: Diagnosis not present

## 2018-05-12 DIAGNOSIS — M9905 Segmental and somatic dysfunction of pelvic region: Secondary | ICD-10-CM | POA: Diagnosis not present

## 2018-05-12 DIAGNOSIS — M51369 Other intervertebral disc degeneration, lumbar region without mention of lumbar back pain or lower extremity pain: Secondary | ICD-10-CM | POA: Insufficient documentation

## 2018-05-12 DIAGNOSIS — M9903 Segmental and somatic dysfunction of lumbar region: Secondary | ICD-10-CM | POA: Diagnosis not present

## 2018-05-12 DIAGNOSIS — M9904 Segmental and somatic dysfunction of sacral region: Secondary | ICD-10-CM | POA: Diagnosis not present

## 2018-05-12 MED ORDER — PREDNISONE 50 MG PO TABS
ORAL_TABLET | ORAL | 0 refills | Status: DC
Start: 2018-05-12 — End: 2018-05-19

## 2018-05-12 NOTE — Progress Notes (Signed)
Subjective:    I'm seeing this patient as a consultation for: Dr. 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  No bowel or bladder dysfunction, saddle numbness, no constitutional symptoms, no radicular pain.  He has had multiple visits with a chiropractor for several months without any improvement.  I reviewed the past medical history, family history, social history, surgical history, and allergies today and no changes were needed.  Please see the problem list section below in epic for further details.  Past Medical History: Past Medical History:  Diagnosis Date  . Migraine    Past Surgical History: Past Surgical History:  Procedure Laterality Date  . None     Social History: Social History   Socioeconomic History  . Marital status: Married    Spouse name: Loistine SimasJohanna  . Number of children: 2  . Years of education: college  . Highest education level: Not on file  Occupational History    Employer: [employer]  Social Needs  . Financial resource strain: Not on file  . Food insecurity:    Worry: Not on file    Inability: Not on file  . Transportation needs:    Medical: Not on file    Non-medical: Not on file  Tobacco Use  . Smoking status: Former Smoker    Start date: 08/10/2004  . Smokeless tobacco: Former NeurosurgeonUser    Types: Snuff    Quit date: 10/10/2013  . Tobacco comment: Quit 2014  Substance and Sexual Activity  . Alcohol use: Yes    Alcohol/week: 0.6 oz    Types: 1 drink(s) per week    Comment: Social   . Drug use: No  . Sexual activity: Not on file  Lifestyle  . Physical activity:    Days per week: Not on file    Minutes per session: Not on file  . Stress: Not on file  Relationships  . Social connections:    Talks on  phone: Not on file    Gets together: Not on file    Attends religious service: Not on file    Active member of club or organization: Not on file    Attends meetings of clubs or organizations: Not on file    Relationship status: Not on file  Other Topics Concern  . Not on file  Social History Narrative   Patient lives at home with his wife and children [name]   Patient works full time for [employer].   Education some college.   Left handed.   Caffeine tea three or four when he is at work. Un sweet tea.   Family History: Family History  Problem Relation Age of Onset  . Hypertension Mother   . Hypertension Father    Allergies: No Known Allergies Medications: See med rec.  Review of Systems: No headache, visual changes, nausea, vomiting, diarrhea, constipation, dizziness, abdominal pain, skin rash, fevers, chills, night sweats, weight loss, swollen lymph nodes, body aches, joint swelling, muscle aches, chest pain, shortness of breath, mood changes, visual or auditory hallucinations.   Objective:   General: Well Developed, well nourished, and in no acute distress.  Neuro:  Extra-ocular muscles intact, able to move all 4 extremities, sensation grossly intact.  Deep tendon reflexes  tested were normal. Psych: Alert and oriented, mood congruent with affect. ENT:  Ears and nose appear unremarkable.  Hearing grossly normal. Neck: Unremarkable overall appearance, trachea midline.  No visible thyroid enlargement. Eyes: Conjunctivae and lids appear unremarkable.  Pupils equal and round. Skin: Warm and dry, no rashes noted.  Cardiovascular: Pulses palpable, no extremity edema. Back Exam:  Inspection: Unremarkable  Motion: Flexion 45 deg, Extension 45 deg, Side Bending to 45 deg bilaterally,  Rotation to 45 deg bilaterally  SLR laying: Negative  XSLR laying: Negative  Palpable tenderness: None. FABER: negative. Sensory change: Gross sensation intact to all  lumbar and sacral dermatomes.  Reflexes: 2+ at both patellar tendons, 2+ at achilles tendons, Babinski's downgoing.  Strength at foot  Plantar-flexion: 5/5 Dorsi-flexion: 5/5 Eversion: 5/5 Inversion: 5/5  Leg strength  Quad: 5/5 Hamstring: 5/5 Hip flexor: 5/5 Hip abductors: 5/5  Gait unremarkable.  Impression and Recommendations:   This case required medical decision making of moderate complexity.  Degenerative disc disease, lumbar Pain is both axial, facetogenic with some discogenic components. Has been through chiropractic manipulation, has had x-rays that were not revealing. Proceeding with an MRI, prednisone, formal physical therapy, return to see me to go over MRI results and we will do 6 weeks of physical therapy. ___________________________________________ [name]. Michael Collier, M.D., ABFM., CAQSM. Primary Care and Sports Medicine [facility]  Adjunct Instructor of Family Medicine  [employer]

## 2018-05-12 NOTE — Assessment & Plan Note (Signed)
Pain is both axial, facetogenic with some discogenic components. Has been through chiropractic manipulation, has had x-rays that were not revealing. Proceeding with an MRI, prednisone, formal physical therapy, return to see me to go over MRI results and we will do 6 weeks of physical therapy.

## 2018-05-15 ENCOUNTER — Ambulatory Visit (HOSPITAL_BASED_OUTPATIENT_CLINIC_OR_DEPARTMENT_OTHER)
Admission: RE | Admit: 2018-05-15 | Discharge: 2018-05-15 | Disposition: A | Discharge status: Home / Self Care | Payer: 59 | Source: Ambulatory Visit | Attending: Sports Medicine | Admitting: Sports Medicine

## 2018-05-15 DIAGNOSIS — M545 Low back pain: Secondary | ICD-10-CM | POA: Diagnosis not present

## 2018-05-15 DIAGNOSIS — M48061 Spinal stenosis, lumbar region without neurogenic claudication: Secondary | ICD-10-CM | POA: Diagnosis not present

## 2018-05-15 DIAGNOSIS — M5126 Other intervertebral disc displacement, lumbar region: Secondary | ICD-10-CM | POA: Diagnosis not present

## 2018-05-15 DIAGNOSIS — M5136 Other intervertebral disc degeneration, lumbar region: Secondary | ICD-10-CM | POA: Diagnosis present

## 2018-05-19 ENCOUNTER — Encounter: Payer: Self-pay | Admitting: Sports Medicine

## 2018-05-19 ENCOUNTER — Ambulatory Visit: Payer: 59 | Admitting: Sports Medicine

## 2018-05-19 DIAGNOSIS — M5136 Other intervertebral disc degeneration, lumbar region: Secondary | ICD-10-CM | POA: Diagnosis not present

## 2018-05-19 DIAGNOSIS — M51369 Other intervertebral disc degeneration, lumbar region without mention of lumbar back pain or lower extremity pain: Secondary | ICD-10-CM

## 2018-05-19 MED ORDER — PREDNISONE 10 MG (48) PO TBPK: ORAL_TABLET | Freq: Every day | ORAL | 0 refills | Status: DC

## 2018-05-19 NOTE — Assessment & Plan Note (Signed)
Lumbar degenerative disc disease, MRI does show prominent finding L4-L5 disc extrusion with mild central canal stenosis. We are going to prolong the course of prednisone with a 12-day taper and he will work hard in formal physical therapy for 4 to 6 weeks. If insufficient relief after physical therapy we will proceed with a bilateral L4-L5 transforaminal epidural.

## 2018-05-19 NOTE — Progress Notes (Signed)
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  He had been through weeks of chiropractic minute ablation without much improvement.  I reviewed the past medical history, family history, social history, surgical history, and allergies today and no changes were needed.  Please see the problem list section below in epic for further details.  Past Medical History: Past Medical History:  Diagnosis Date  . Migraine    Past Surgical History: Past Surgical History:  Procedure Laterality Date  . None     Social History: Social History   Socioeconomic History  . Marital status: Married    Spouse name: Loistine SimasJohanna  . Number of children: 2  . Years of education: college  . Highest education level: Not on file  Occupational History    Employer: [employer]  Social Needs  . Financial resource strain: Not on file  . Food insecurity:    Worry: Not on file    Inability: Not on file  . Transportation needs:    Medical: Not on file    Non-medical: Not on file  Tobacco Use  . Smoking status: Former Smoker    Start date: 08/10/2004  . Smokeless tobacco: Former NeurosurgeonUser    Types: Snuff    Quit date: 10/10/2013  . Tobacco comment: Quit 2014  Substance and Sexual Activity  . Alcohol use: Yes    Alcohol/week: 0.6 oz    Types: 1 drink(s) per week    Comment: Social   . Drug use: No  . Sexual activity: Not on file  Lifestyle  . Physical activity:    Days per week: Not on file    Minutes per session: Not on file  . Stress: Not on file  Relationships  . Social connections:    Talks on phone: Not on file    Gets together: Not on file    Attends religious service: Not on file    Active member of club or organization: Not on file    Attends meetings of  clubs or organizations: Not on file    Relationship status: Not on file  Other Topics Concern  . Not on file  Social History Narrative   Patient lives at home with his wife and children [name]   Patient works full time [employer] [place] [employer].   Education some college.   Left handed.   Caffeine tea three or four when he is at work. Un sweet tea.   Family History: Family History  Problem Relation Age of Onset  . Hypertension Mother   . Hypertension Father    Allergies: No Known Allergies Medications: See med rec.  Review of Systems: No fevers, chills, night sweats, weight loss, chest pain, or shortness of breath.   Objective:    General: Well Developed, well nourished, and in no acute distress.  Neuro: Alert and oriented x3, extra-ocular muscles intact, sensation grossly intact.  HEENT: Normocephalic, atraumatic, pupils equal round reactive to light, neck supple, no masses, no lymphadenopathy, thyroid nonpalpable.  Skin: Warm and dry, no rashes. Cardiac: Regular rate and rhythm, no murmurs rubs or gallops, no lower extremity edema.  Respiratory: Clear to auscultation bilaterally. Not using accessory muscles, speaking in full sentences.  MRI shows L4-5 central canal stenosis, multifactorial from  ligamentum flavum hypertrophy and disc protrusion, and lesser so at the L5-S1 level.  Impression and Recommendations:    Degenerative disc disease, lumbar Lumbar degenerative disc disease, MRI does show prominent finding L4-L5 disc extrusion with mild central canal stenosis. We are going to prolong the course of prednisone with a 12-day taper and he will work hard in formal physical therapy for 4 to 6 weeks. If insufficient relief after physical therapy we will proceed with a bilateral L4-L5 transforaminal epidural.  I spent 25 minutes with this patient, greater than 50% was face-to-face time counseling regarding the above  diagnoses ___________________________________________ [name]. Benjamin Stain, M.D., ABFM., CAQSM. Primary Care and Sports Medicine [facility]  Adjunct Instructor of Family Medicine  [employer]

## 2018-05-20 ENCOUNTER — Encounter: Payer: Self-pay | Admitting: Physical Therapy

## 2018-05-20 ENCOUNTER — Ambulatory Visit: Payer: 59 | Admitting: Physical Therapy

## 2018-05-20 DIAGNOSIS — M545 Low back pain, unspecified: Secondary | ICD-10-CM

## 2018-05-20 DIAGNOSIS — M5136 Other intervertebral disc degeneration, lumbar region: Secondary | ICD-10-CM | POA: Diagnosis not present

## 2018-05-20 DIAGNOSIS — G8929 Other chronic pain: Secondary | ICD-10-CM

## 2018-05-20 NOTE — Patient Instructions (Signed)
Access Code: [email or web address]  URL: [email or web address]  Date: 05/20/2018  Prepared by: Ivery QualeBrian Nelson   Exercises  Supine Quadriceps Stretch with Strap on Table - 3 sets - 30 hold - 2x daily - 6x weekly  Supine Hamstring Stretch with Strap - 10 reps - 3 sets - 2x daily - 6x weekly  Supine Piriformis Stretch - 10 reps - 3 sets - 2x daily - 6x weekly  Hooklying Transversus Abdominis Palpation - 10 reps - 1-2 sets - 2x daily - 6x weekly  Supine Bridge - 10 reps - 1-3 sets - 2x daily - 6x weekly  Hooklying Sequential Leg March and Lower - 10 reps - 3 sets - 2x daily - 6x weekly  Seated Diagonal Chop - 10 reps - 1-3 sets - 2x daily - 6x weekly  Plank on Knees - 10 reps - 3 sets - 2x daily - 6x weekly   Pt provided illustrated copy

## 2018-05-20 NOTE — Therapy (Signed)
[facility] [place] [address] [place], [place], [address] Phone: [phone]   Fax:  [phone]  Physical Therapy Evaluation  Patient Details  Name: Michael Collier MRN: [id number] Date of Birth: 09-03-1981 Referring Provider: Benjamin Stain   Encounter Date: 05/20/2018  PT End of Session - 05/20/18 0957    Visit Number  1    Number of Visits  12    Date for PT Re-Evaluation  07/01/18    PT Start Time  0800    PT Stop Time  0855    PT Time Calculation (min)  55 min    Activity Tolerance  Patient tolerated treatment well    Behavior During Therapy  [facility] for tasks assessed/performed       Past Medical History:  Diagnosis Date  . Migraine     Past Surgical History:  Procedure Laterality Date  . None      There were no vitals filed for this visit.   Subjective Assessment - 05/20/18 0814    Subjective  Pt relays chronic LBP for 20 years that has been on and off but became worse in end of June when he started a running program. He has had some improvement with chriopractor but has not been able to fully resolve his LBP. He was then referred to PT.    Limitations  Lifting;Standing;Walking;House hold activities    How long can you sit comfortably?  depends    How long can you stand comfortably?  depends    How long can you walk comfortably?  depends    Diagnostic tests  MRI shows L4-5 central canal stenosis, multifactorial from ligamentum flavum hypertrophy and disc protrusion, and lesser so at the L5-S1 level.    Patient Stated Goals  Pt would like to not be limited by back pain    Currently in Pain?  Yes    Pain Score  5     Pain Location  Back    Pain Descriptors / Indicators  Sharp    Pain Type  Chronic pain    Pain Onset  More than a month ago    Pain Frequency  Constant    Aggravating Factors   bending, quick movements    Pain Relieving Factors  some relief from chiropractor         [facility] PT Assessment -  05/20/18 0001      Assessment   Medical Diagnosis  LBP with DDD    Referring Provider  Thekkekandam    Onset Date/Surgical Date  04/20/18    Next MD Visit  4-6 weeks      Precautions   Precautions  None      Balance Screen   Has the patient fallen in the past 6 months  No      Home Environment   Living Environment  Private residence      Prior Function   Level of Independence  Independent      Cognition   Overall Cognitive Status  Within Functional Limits for tasks assessed      Observation/Other Assessments   Focus on Therapeutic Outcomes (FOTO)   58% limit      ROM / Strength   AROM / PROM / Strength  AROM;Strength      AROM   AROM Assessment Site  Lumbar    Lumbar Flexion  75%    Lumbar Extension  90%    Lumbar - Right Side Bend  75%    Lumbar - Left  Side Bend  75%    Lumbar - Right Rotation  75%    Lumbar - Left Rotation  75%      Strength   Overall Strength  -- LE strength WFL, core strength is poor      Flexibility   Soft Tissue Assessment /Muscle Length  -- mod to severe tightness in quads, HS, and lumbar P.S bilat      Palpation   Palpation comment  ttp lumbar spine with hypomobility L4-S1 and in lumbar P.S.      Special Tests   Other special tests  Neg slump, SLR, FABERS, quadrant tests bilat      Ambulation/Gait   Gait Comments  WFL gait pattern                Objective measurements completed on examination: See above findings.      [facility] Adult PT Treatment/Exercise - 05/20/18 0001      Modalities   Modalities  Electrical Stimulation;Cryotherapy      Cryotherapy   Number Minutes Cryotherapy  15 Minutes    Cryotherapy Location  Lumbar Spine    Type of Cryotherapy  Ice pack      Electrical Stimulation   Electrical Stimulation Location  lumbar    Electrical Stimulation Action  IFC    Electrical Stimulation Parameters  to tolerance    Electrical Stimulation Goals  Pain;Tone      Manual Therapy   Manual Therapy  Joint  mobilization    Joint Mobilization  gentle grade 1 central PA mobs L4-S1             PT Education - 05/20/18 0845    Education Details  HEP and POC EDU    Person(s) Educated  Patient    Methods  Explanation;Demonstration;Verbal cues;Handout    Comprehension  Verbalized understanding;Need further instruction          PT Long Term Goals - 05/20/18 0852      PT LONG TERM GOAL #1   Title  Pt will be I and compliat with HEP. 6 weeks 07/01/18    Status  New      PT LONG TERM GOAL #2   Title  Pt will increase foto to <37% limited to show improved functional abilites. 6 weeks 07/01/18      PT LONG TERM GOAL #3   Title  Pt will increase lumbar ROM to WNL to increase funciton. 6 weeks 07/01/18      PT LONG TERM GOAL #4   Title  Pt will reduce overall pain to no more than 2/10 for ADL's and work activities. 6 weeks 07/01/18    Status  New             Plan - 05/20/18 1002    Clinical Impression Statement  Pt presents with acute exacerbation of chronic LBP that is localized to lower lumbar spine and Paraspinals. He has good overall Leg strength and denies radiculopathy. He does have LE/lumbar tightness and hypomobiliy along with decreased core strength. Special testing negative today and he did not have a direction specific preference to fleixon or extension. He will benefit from PT to address these deficits.     Clinical Presentation  Stable    Clinical Decision Making  Low    Rehab Potential  Good    PT Frequency  2x / week    PT Duration  6 weeks    PT Treatment/Interventions  ADLs/Self Care Home Management;Cryotherapy;Electrical Stimulation;Iontophoresis 4mg /ml Dexamethasone;Moist Heat;Traction;Ultrasound;Therapeutic activities;Therapeutic  exercise;Neuromuscular re-education;Manual techniques;Dry needling    PT Next Visit Plan  review HEP, progress stretching and core strength, MT and modlaities PRN    Consulted and Agree with Plan of Care  Patient       Patient will  benefit from skilled therapeutic intervention in order to improve the following deficits and impairments:  Decreased activity tolerance, Decreased range of motion, Decreased strength, Hypomobility, Impaired flexibility  Visit Diagnosis: Chronic bilateral low back pain without sciatica  Degeneration of lumbar intervertebral disc     Problem List Patient Active Problem List   Diagnosis Date Noted  . Degenerative disc disease, lumbar 05/12/2018  . Paresthesia 09/05/2014  . Migraine   . OTHER DISEASES OF NASAL CAVITY AND SINUSES 08/15/2008    April Manson, PT, DPT 05/20/2018, 10:12 AM  [facility] [address] [place], [place], [address] Phone: [phone]   Fax:  [phone]  Name: Michael Collier MRN: [id number] Date of Birth: 12-01-1980

## 2018-05-28 ENCOUNTER — Ambulatory Visit: Payer: 59 | Admitting: Physical Therapy

## 2018-05-28 DIAGNOSIS — M51369 Other intervertebral disc degeneration, lumbar region without mention of lumbar back pain or lower extremity pain: Secondary | ICD-10-CM

## 2018-05-28 DIAGNOSIS — M5136 Other intervertebral disc degeneration, lumbar region: Secondary | ICD-10-CM | POA: Diagnosis not present

## 2018-05-28 DIAGNOSIS — M545 Low back pain: Secondary | ICD-10-CM | POA: Diagnosis not present

## 2018-05-28 DIAGNOSIS — G8929 Other chronic pain: Secondary | ICD-10-CM | POA: Diagnosis not present

## 2018-05-28 NOTE — Therapy (Signed)
[place], [place], [address] Phone: [phone]   Fax:  [phone]  Physical Therapy Treatment  Patient Details  Name: Michael Collier MRN: [id number] Date of Birth: 14-Jun-1981 Referring Provider: Dr. Dianah Field   Encounter Date: 05/28/2018  PT End of Session - 05/28/18 0809    Visit Number  2    Number of Visits  12    Date for PT Re-Evaluation  07/01/18    PT Start Time  0803    PT Stop Time  0900    PT Time Calculation (min)  57 min    Activity Tolerance  Patient tolerated treatment well;No increased pain    Behavior During Therapy  WFL for tasks assessed/performed       Past Medical History:  Diagnosis Date  . Migraine     Past Surgical History:  Procedure Laterality Date  . None      There were no vitals filed for this visit.  Subjective Assessment - 05/28/18 0805    Subjective  Pt reports he has just returned from vacation. Did a lot of walking in [place].  He is still on prednisone for a few more days.     Currently in Pain?  Yes    Pain Score  2     Pain Location  Back    Pain Orientation  Lower center    Pain Descriptors / Indicators  Sharp    Aggravating Factors   bending, quick movements     Pain Relieving Factors  sitting down         [facility] PT Assessment - 05/28/18 0001      Assessment   Medical Diagnosis  LBP with DDD    Referring Provider  Dr. Dianah Field    Onset Date/Surgical Date  04/20/18        [facility] Adult PT Treatment/Exercise - 05/28/18 0001      Self-Care   Self-Care  Other Self-Care Comments    Other Self-Care Comments   Pt instructed in supine to/from sit via log roll; pt returned demo 2 x with cues.  Pt educated on TA engagement with transitional movements as well as with lifting, bending, etc.  Pt verbalized understanding.  Posture/ Health and safety inspector.       Lumbar Exercises: Stretches   Passive Hamstring Stretch  Right;Left;3 reps;30  seconds supine with strap, opp knee bent    Standing Extension  2 reps 3 sec hold    Prone on Elbows Stretch  -- 8 reps, 5-10 sec hold    Press Ups  3 reps 5 sec hold; pain increased - changed to POE    Quad Stretch  Right;Left;3 reps 45 sec    Piriformis Stretch  Right;Left;2 reps;60 seconds fig 4, knee towards opp shoulder      Lumbar Exercises: Seated   Other Seated Lumbar Exercises  TA engagement with self-tactile cues for monitoring - 5 sec hold x 5 reps      Lumbar Exercises: Supine   Ab Set  10 reps;5 seconds    AB Set Limitations  trial of lap press with 2 part core engaged x 5 reps      Modalities   Modalities  Electrical Stimulation;Cryotherapy      Moist Heat Therapy   Number Minutes Moist Heat  --      Cryotherapy   Number Minutes Cryotherapy  15 Minutes    Cryotherapy Location  Lumbar Spine    Type of  Cryotherapy  Ice pack      Electrical Stimulation   Electrical Stimulation Location  bilat lower lumbar paraspinals    Electrical Stimulation Action  IFC    Electrical Stimulation Parameters  to tolerance     Electrical Stimulation Goals  Pain             PT Education - 05/28/18 1049    Education Details  HEP (modified); TENS info; posture/body Radiographer, therapeutic) Educated  Patient    Methods  Explanation;Handout    Comprehension  Verbalized understanding          PT Long Term Goals - 05/28/18 0817      PT LONG TERM GOAL #1   Title  Pt will be I and compliant with HEP. (6 weeks 07/01/18)    Status  On-going      PT LONG TERM GOAL #2   Title  Pt will increase foto to <37% limited to show improved functional abilites. (6 weeks 07/01/18)    Status  On-going      PT LONG TERM GOAL #3   Title  Pt will increase lumbar ROM to WNL to increase funciton. (6 weeks 07/01/18)    Status  On-going      PT LONG TERM GOAL #4   Title  Pt will reduce overall pain to no more than 2/10 for ADL's and work activities. (6 weeks 07/01/18)    Status  On-going             Plan - 05/28/18 0820    Clinical Impression Statement  Pt's pain level less than last visit, however he is on another round of Prednisone at this time.  He only completed a few of the exercises of HEP; was busy on vacation and was confused about reps, sets, and technique.  Modified HEP: eliminated rotation exercise and plank.  RLE slightly tighter than LLE (both quads and hams).  He required multiple cues to engage his TA proprly, but once he got it he successfully completed multiple reps of contraction.  He had some increased discomfort with press ups; improved comfort with POE.  No goals met yet; only 2nd visit.     Rehab Potential  Good    PT Frequency  2x / week    PT Duration  6 weeks    PT Treatment/Interventions  ADLs/Self Care Home Management;Cryotherapy;Electrical Stimulation;Iontophoresis 99m/ml Dexamethasone;Moist Heat;Traction;Ultrasound;Therapeutic activities;Therapeutic exercise;Neuromuscular re-education;Manual techniques;Dry needling    PT Next Visit Plan  continue LE stretches, back education, build on TA engagement with functional exercises. traction in future visit if pain persists.     Consulted and Agree with Plan of Care  Patient       Patient will benefit from skilled therapeutic intervention in order to improve the following deficits and impairments:  Decreased activity tolerance, Decreased range of motion, Decreased strength, Hypomobility, Impaired flexibility  Visit Diagnosis: Chronic bilateral low back pain without sciatica  Degeneration of lumbar intervertebral disc     Problem List Patient Active Problem List   Diagnosis Date Noted  . Degenerative disc disease, lumbar 05/12/2018  . Paresthesia 09/05/2014  . Migraine   . OTHER DISEASES OF NASAL CAVITY AND SINUSES 08/15/2008   JKerin Perna PTA 05/28/18 4:20 PM  [facility] [address] [address]Phone:  [phone]  Fax:  [phone] Name: Michael PaolucciMRN: [id number] of Birth: 805/24/1982

## 2018-05-28 NOTE — Patient Instructions (Addendum)
* modify: take out the plank, wood chop * lumbar support in chairs and car.    On Elbows (Prone)    Rise up on elbows as high as possible, keeping hips on floor. Hold _10___ seconds. Repeat __5__ times per set. Do __1__ sets per session. Do __2__ sessions per day.  OR-- Trunk Extension    Standing, place back of open hands on low back. Straighten spine then arch the back and move shoulders back. Repeat __3__ times per session.    HIP: Hamstrings - Supine  Place strap around foot. Raise leg up, keeping knee straight.  Bend opposite knee to protect back if indicated. Hold 30 seconds. 3 reps per set, 2-3 sets per day    Piriformis Stretch   Lying on back, pull right knee toward opposite shoulder. Hold 30 seconds. Repeat 3 times. Do 2-3 sessions per day.  Quads / HF, Prone KNEE: Quadriceps - Prone      Place strap around ankle. Bring ankle toward buttocks. Press hip into surface. Hold 30 seconds. Repeat 3 times per session. Do 2-3 sessions per day. Sleeping on Back  Place pillow under knees. A pillow with cervical support and a roll around waist are also helpful. Copyright  VHI. All rights reserved.  Sleeping on Side Place pillow between knees. Use cervical support under neck and a roll around waist as needed. Copyright  VHI. All rights reserved.   Sleeping on Stomach   If this is the only desirable sleeping position, place pillow under lower legs, and under stomach or chest as needed.  Posture - Sitting   Sit upright, head facing forward. Try using a roll to support lower back. Keep shoulders relaxed, and avoid rounded back. Keep hips level with knees. Avoid crossing legs for long periods. Stand to Sit / Sit to Stand   To sit: Bend knees to lower self onto front edge of chair, then scoot back on seat. To stand: Reverse sequence by placing one foot forward, and scoot to front of seat. Use rocking motion to stand up.   Work Height and Reach  Ideal work  height is no more than 2 to 4 inches below elbow level when standing, and at elbow level when sitting. Reaching should be limited to arm's length, with elbows slightly bent.  Bending  Bend at hips and knees, not back. Keep feet shoulder-width apart.    Posture - Standing   Good posture is important. Avoid slouching and forward head thrust. Maintain curve in low back and align ears over shoul- ders, hips over ankles.  Alternating Positions   Alternate tasks and change positions frequently to reduce fatigue and muscle tension. Take rest breaks. Computer Work   Position work to Art gallery manager. Use proper work and seat height. Keep shoulders back and down, wrists straight, and elbows at right angles. Use chair that provides full back support. Add footrest and lumbar roll as needed.  Getting Into / Out of Car  Lower self onto seat, scoot back, then bring in one leg at a time. Reverse sequence to get out.  Dressing  Lie on back to pull socks or slacks over feet, or sit and bend leg while keeping back straight.    Housework - Sink  Place one foot on ledge of cabinet under sink when standing at sink for prolonged periods.   Pushing / Pulling  Pushing is preferable to pulling. Keep back in proper alignment, and use leg muscles to do the work.  Deep Squat  Squat and lift with both arms held against upper trunk. Tighten stomach muscles without holding breath. Use smooth movements to avoid jerking.  Avoid Twisting   Avoid twisting or bending back. Pivot around using foot movements, and bend at knees if needed when reaching for articles.  Carrying Luggage   Distribute weight evenly on both sides. Use a cart whenever possible. Do not twist trunk. Move body as a unit.   Lifting Principles .Maintain proper posture and head alignment. .Slide object as close as possible before lifting. .Move obstacles out of the way. .Test before lifting; ask for help if too heavy. .Tighten  stomach muscles without holding breath. .Use smooth movements; do not jerk. .Use legs to do the work, and pivot with feet. .Distribute the work load symmetrically and close to the center of trunk. .Push instead of pull whenever possible.   Ask For Help   Ask for help and delegate to others when possible. Coordinate your movements when lifting together, and maintain the low back curve.  Log Roll   Lying on back, bend left knee and place left arm across chest. Roll all in one movement to the right. Reverse to roll to the left. Always move as one unit. Housework - Sweeping  Use long-handled equipment to avoid stooping.   Housework - Wiping  Position yourself as close as possible to reach work surface. Avoid straining your back.  Laundry - Unloading Wash   To unload small items at bottom of washer, lift leg opposite to arm being used to reach.  Gardening - Raking  Move close to area to be raked. Use arm movements to do the work. Keep back straight and avoid twisting.     Cart  When reaching into cart with one arm, lift opposite leg to keep back straight.   Getting Into / Out of Bed  Lower self to lie down on one side by raising legs and lowering head at the same time. Use arms to assist moving without twisting. Bend both knees to roll onto back if desired. To sit up, start from lying on side, and use same move-ments in reverse. Housework - Vacuuming  Hold the vacuum with arm held at side. Step back and forth to move it, keeping head up. Avoid twisting.   Laundry - Armed forces training and education officerLoading Wash  Position laundry basket so that bending and twisting can be avoided.   Laundry - Unloading Dryer  Squat down to reach into clothes dryer or use a reacher.  Gardening - Weeding / Psychiatric nurselanting  Squat or Kneel. Knee pads may be helpful.                    TENS UNIT  This is helpful for muscle pain and spasm.   Search and Purchase a TENS 7000 2nd edition at [employer]amazon.com  (It  should be less than $30)     TENS unit instructions:   Do not shower or bathe with the unit on  Turn the unit off before removing electrodes or batteries  If the electrodes lose stickiness add a drop of water to the electrodes after they are disconnected from the unit and place on plastic sheet. If you continued to have difficulty, call the TENS unit company to purchase more electrodes.  Do not apply lotion on the skin area prior to use. Make sure the skin is clean and dry as this will help prolong the life of the electrodes.  After use, always check skin for unusual red areas,  rash or other skin difficulties. If there are any skin problems, does not apply electrodes to the same area.  Never remove the electrodes from the unit by pulling the wires.  Do not use the TENS unit or electrodes other than as directed.  Do not change electrode placement without consulting your therapist or physician.  Keep 2 fingers with between each electrode.

## 2018-06-01 ENCOUNTER — Encounter: Payer: Self-pay | Admitting: Physical Therapy

## 2018-06-01 ENCOUNTER — Ambulatory Visit: Payer: 59 | Admitting: Physical Therapy

## 2018-06-01 DIAGNOSIS — M545 Low back pain, unspecified: Secondary | ICD-10-CM

## 2018-06-01 DIAGNOSIS — M5136 Other intervertebral disc degeneration, lumbar region: Secondary | ICD-10-CM

## 2018-06-01 DIAGNOSIS — G8929 Other chronic pain: Secondary | ICD-10-CM | POA: Diagnosis not present

## 2018-06-01 NOTE — Therapy (Signed)
[place], [place], [address] Phone: [phone]   Fax:  [phone]  Physical Therapy Treatment  Patient Details  Name: Michael Collier MRN: [id number] Date of Birth: 13-Jan-1981 Referring Provider: Dr. Darene Lamer   Encounter Date: 06/01/2018  PT End of Session - 06/01/18 0930    Visit Number  3    Number of Visits  12    Date for PT Re-Evaluation  07/01/18    PT Start Time  0845    PT Stop Time  0930    PT Time Calculation (min)  45 min    Activity Tolerance  Patient tolerated treatment well;No increased pain    Behavior During Therapy  WFL for tasks assessed/performed       Past Medical History:  Diagnosis Date  . Migraine     Past Surgical History:  Procedure Laterality Date  . None      There were no vitals filed for this visit.  Subjective Assessment - 06/01/18 0856    Subjective  Pt relays his back is feeling better    Limitations  Lifting;Standing;Walking;House hold activities    How long can you sit comfortably?  depends    How long can you stand comfortably?  depends    How long can you walk comfortably?  depends    Diagnostic tests  MRI shows L4-5 central canal stenosis, multifactorial from ligamentum flavum hypertrophy and disc protrusion, and lesser so at the L5-S1 level.    Patient Stated Goals  Pt would like to not be limited by back pain    Currently in Pain?  Yes    Pain Score  1     Pain Location  Back    Pain Orientation  Lower    Pain Descriptors / Indicators  Aching    Pain Type  Chronic pain    Pain Onset  More than a month ago    Pain Frequency  Intermittent    Aggravating Factors   bending, quick movments    Pain Relieving Factors  rest         [facility] PT Assessment - 06/01/18 0928      Assessment   Medical Diagnosis  LBP with DDD    Referring Provider  Dr. Darene Lamer    Onset Date/Surgical Date  04/20/18    Next MD Visit  4-6 weeks      Precautions   Precautions  None       [place] residence      Prior Function   Level of Independence  Independent      Cognition   Overall Cognitive Status  Within Functional Limits for tasks assessed      Observation/Other Assessments   Focus on Therapeutic Outcomes (FOTO)   --      AROM   Lumbar Flexion  90%    Lumbar Extension  WNL    Lumbar - Right Side Bend  80%    Lumbar - Left Side Bend  80%    Lumbar - Right Rotation  80%    Lumbar - Left Rotation  80%      Strength   Overall Strength  -- LE strength WFL, core strength is poor      Flexibility   Soft Tissue Assessment /Muscle Length  -- mod to severe tightness in quads, HS, and lumbar P.S bilat      Palpation   Palpation comment  --  Special Tests   Other special tests  --      Ambulation/Gait   Gait Comments  WFL gait pattern                   [facility] Adult PT Treatment/Exercise - 06/01/18 0001      Neuro Re-ed    Neuro Re-ed Details   core strengthening      Lumbar Exercises: Stretches   Passive Hamstring Stretch  Right;Left;2 reps;30 seconds    Lower Trunk Rotation  3 reps;10 seconds    Prone on Elbows Stretch  30 seconds;2 reps    Quad Stretch  Right;Left;2 reps;30 seconds    Piriformis Stretch  Right;Left;2 reps;30 seconds      Lumbar Exercises: Aerobic   Elliptical  6 min      Lumbar Exercises: Supine   Dead Bug  10 reps core    Bridge  5 reps;15 reps    Isometric Hip Flexion  10 reps;5 seconds with core      Lumbar Exercises: Quadruped   Straight Leg Raise  10 reps core      Manual Therapy   Manual Therapy  Joint mobilization    Joint Mobilization  grade 2-3 central PA mobs L4-S1             PT Education - 06/01/18 0929    Education Details  technique for new exercises    Person(s) Educated  Patient    Methods  Explanation;Demonstration;Verbal cues    Comprehension  Verbalized understanding;Returned demonstration;Need further instruction          PT  Long Term Goals - 06/01/18 0935      PT LONG TERM GOAL #1   Title  Pt will be I and compliant with HEP. (6 weeks 07/01/18)    Status  On-going      PT LONG TERM GOAL #2   Title  Pt will increase foto to <37% limited to show improved functional abilites. (6 weeks 07/01/18)    Status  On-going      PT LONG TERM GOAL #3   Title  Pt will increase lumbar ROM to WNL to increase funciton. (6 weeks 07/01/18)    Status  Partially Met      PT LONG TERM GOAL #4   Title  Pt will reduce overall pain to no more than 2/10 for ADL's and work activities. (6 weeks 07/01/18)    Status  On-going            Plan - 06/01/18 0932    Clinical Impression Statement  Pt had less pain today and increased ROM and was able to progress his core strengthening program with good tolerance. He was treated with lumbar spinal mobs and had less TTP and improved mobility after sesison. Pt will continue to benefit from PT.    Rehab Potential  Good    PT Frequency  2x / week    PT Duration  6 weeks    PT Treatment/Interventions  ADLs/Self Care Home Management;Cryotherapy;Electrical Stimulation;Iontophoresis 66m/ml Dexamethasone;Moist Heat;Traction;Ultrasound;Therapeutic activities;Therapeutic exercise;Neuromuscular re-education;Manual techniques;Dry needling    PT Next Visit Plan  progress core, continue stretching    Consulted and Agree with Plan of Care  Patient       Patient will benefit from skilled therapeutic intervention in order to improve the following deficits and impairments:  Decreased activity tolerance, Decreased range of motion, Decreased strength, Hypomobility, Impaired flexibility  Visit Diagnosis: Chronic bilateral low back pain without sciatica  Degeneration of  lumbar intervertebral disc     Problem List Patient Active Problem List   Diagnosis Date Noted  . Degenerative disc disease, lumbar 05/12/2018  . Paresthesia 09/05/2014  . Migraine   . OTHER DISEASES OF NASAL CAVITY AND SINUSES  08/15/2008    Debbe Odea, PT, DPT 06/01/2018, 9:37 AM  [facility] [place], [place], [address] Phone: [phone]   Fax:  [phone]  Name: Michael Collier MRN: [id number] Date of Birth: Jun 19, 1981

## 2018-06-04 ENCOUNTER — Ambulatory Visit: Payer: 59 | Admitting: Physical Therapy

## 2018-06-04 DIAGNOSIS — M5136 Other intervertebral disc degeneration, lumbar region: Secondary | ICD-10-CM

## 2018-06-04 DIAGNOSIS — M545 Low back pain, unspecified: Secondary | ICD-10-CM

## 2018-06-04 DIAGNOSIS — G8929 Other chronic pain: Secondary | ICD-10-CM

## 2018-06-04 NOTE — Therapy (Signed)
[place], [place], [address] Phone: [phone]   Fax:  [phone]  Physical Therapy Treatment  Patient Details  Name: Michael Collier MRN: [id number] Date of Birth: 13-Mar-1981 Referring Provider: Dr. Darene Lamer   Encounter Date: 06/04/2018  PT End of Session - 06/04/18 0815    Visit Number  4    Number of Visits  12    Date for PT Re-Evaluation  07/01/18    PT Start Time  0800    PT Stop Time  0845    PT Time Calculation (min)  45 min    Activity Tolerance  Patient tolerated treatment well;No increased pain    Behavior During Therapy  WFL for tasks assessed/performed       Past Medical History:  Diagnosis Date  . Migraine     Past Surgical History:  Procedure Laterality Date  . None      There were no vitals filed for this visit.  Subjective Assessment - 06/04/18 0809    Subjective   Pt relays he has finished his prednisone so now he has more tightness in his back and his back feels "wierd, like something is floating around back there"    Diagnostic tests  MRI shows L4-5 central canal stenosis, multifactorial from ligamentum flavum hypertrophy and disc protrusion, and lesser so at the L5-S1 level.    Currently in Pain?  Yes    Pain Score  2     Pain Location  Back    Pain Descriptors / Indicators  Tightness    Pain Onset  More than a month ago    Pain Frequency  Intermittent                       [facility] Adult PT Treatment/Exercise - 06/04/18 0812      Ambulation/Gait   Gait Comments  WFL gait pattern      Neuro Re-ed    Neuro Re-ed Details   core strengthening      Lumbar Exercises: Stretches   Passive Hamstring Stretch  Right;Left;2 reps;30 seconds    Lower Trunk Rotation  -- 5 sec X 10 bilat    Prone on Elbows Stretch  30 seconds;3 reps    Quadruped Mid Back Stretch  30 seconds;3 reps    Quad Stretch  Right;Left;2 reps;30 seconds    Piriformis Stretch  Right;Left;2  reps;30 seconds      Lumbar Exercises: Aerobic   Elliptical  5 min      Lumbar Exercises: Standing   Row  20 reps;Theraband    Theraband Level (Row)  Level 3 (Green)    Shoulder Extension  20 reps;Theraband    Theraband Level (Shoulder Extension)  Level 3 (Green)      Lumbar Exercises: Supine   Dead Bug  15 reps    Bridge  5 seconds;20 reps    Isometric Hip Flexion  10 reps;5 seconds      Lumbar Exercises: Quadruped   Straight Leg Raise  10 reps    Plank  modified to knees, 30 sec X  2      Manual Therapy   Manual Therapy  Joint mobilization;Soft tissue mobilization    Joint Mobilization  grade 2-3 central PA mobs L4-S1    Soft tissue mobilization  STM/IASTM to lumbar p.S                  PT Long Term Goals - 06/01/18 1594  PT LONG TERM GOAL #1   Title  Pt will be I and compliant with HEP. (6 weeks 07/01/18)    Status  On-going      PT LONG TERM GOAL #2   Title  Pt will increase foto to <37% limited to show improved functional abilites. (6 weeks 07/01/18)    Status  On-going      PT LONG TERM GOAL #3   Title  Pt will increase lumbar ROM to WNL to increase funciton. (6 weeks 07/01/18)    Status  Partially Met      PT LONG TERM GOAL #4   Title  Pt will reduce overall pain to no more than 2/10 for ADL's and work activities. (6 weeks 07/01/18)    Status  On-going            Plan - 06/04/18 0816    Clinical Impression Statement  Pt had more tightness today in his back and was treated with more stretching program and manual therapy for STM and IASTM to paraspinals all to decrease tightness. Core strengthening program slightly progressed and he is making progress in this area. He will continue to benefit from PT.    Rehab Potential  Good    PT Frequency  2x / week    PT Duration  6 weeks    PT Treatment/Interventions  ADLs/Self Care Home Management;Cryotherapy;Electrical Stimulation;Iontophoresis 57m/ml Dexamethasone;Moist  Heat;Traction;Ultrasound;Therapeutic activities;Therapeutic exercise;Neuromuscular re-education;Manual techniques;Dry needling    PT Next Visit Plan  progress core, continue stretching    Consulted and Agree with Plan of Care  Patient       Patient will benefit from skilled therapeutic intervention in order to improve the following deficits and impairments:  Decreased activity tolerance, Decreased range of motion, Decreased strength, Hypomobility, Impaired flexibility  Visit Diagnosis: Chronic bilateral low back pain without sciatica  Degeneration of lumbar intervertebral disc     Problem List Patient Active Problem List   Diagnosis Date Noted  . Degenerative disc disease, lumbar 05/12/2018  . Paresthesia 09/05/2014  . Migraine   . OTHER DISEASES OF NASAL CAVITY AND SINUSES 08/15/2008    BDebbe Odea PT, DPT 06/04/2018, 8:44 AM  [facility] [place] [address]Phone: [phone]  Fax:  [phone] Name: Michael CovinoMRN: [id number] of Birth: [birth year]

## 2018-06-10 ENCOUNTER — Ambulatory Visit: Payer: 59 | Admitting: Physical Therapy

## 2018-06-10 DIAGNOSIS — G8929 Other chronic pain: Secondary | ICD-10-CM | POA: Diagnosis not present

## 2018-06-10 DIAGNOSIS — M545 Low back pain: Secondary | ICD-10-CM

## 2018-06-10 DIAGNOSIS — M5136 Other intervertebral disc degeneration, lumbar region: Secondary | ICD-10-CM

## 2018-06-10 NOTE — Therapy (Signed)
[place], [place], [address] Phone: [phone]   Fax:  [phone]  Physical Therapy Treatment  Patient Details  Name: Michael Collier MRN: [id number] Date of Birth: [birth year] Referring Provider: Dr. Darene Lamer   Encounter Date: 06/10/2018  PT End of Session - 06/10/18 0933    Visit Number  5    Number of Visits  12    Date for PT Re-Evaluation  07/01/18    PT Start Time  0845    PT Stop Time  0940 ice pack lat 10 min    PT Time Calculation (min)  55 min    Activity Tolerance  Patient tolerated treatment well;No increased pain    Behavior During Therapy  WFL for tasks assessed/performed       Past Medical History:  Diagnosis Date  . Migraine     Past Surgical History:  Procedure Laterality Date  . None      There were no vitals filed for this visit.  Subjective Assessment - 06/10/18 0845    Subjective  Pt relays pain is low but he is having more soreness and tightness after he began playing softball.    Currently in Pain?  Yes    Pain Score  2     Pain Location  Back    Pain Orientation  Lower    Pain Descriptors / Indicators  Tightness    Pain Type  Chronic pain    Pain Onset  More than a month ago    Pain Frequency  Intermittent         [facility] PT Assessment - 06/10/18 0850      Assessment   Medical Diagnosis  LBP with DDD    Referring Provider  Dr. Darene Lamer    Onset Date/Surgical Date  04/20/18      AROM   AROM Assessment Site  Lumbar    Lumbar Flexion  90%    Lumbar Extension  WNL    Lumbar - Right Side Bend  90%    Lumbar - Left Side Bend  90%    Lumbar - Right Rotation  80%    Lumbar - Left Rotation  80%      Strength   Overall Strength  -- core strength improving can hold mod plank 30 sec X 3      Flexibility   Soft Tissue Assessment /Muscle Length  -- improvements to now only mild tightness in quads, mod in H.S                   [facility] Adult PT Treatment/Exercise  - 06/10/18 0850      Ambulation/Gait   Gait Comments  WFL gait pattern      Neuro Re-ed    Neuro Re-ed Details   core strengthening      Lumbar Exercises: Stretches   Passive Hamstring Stretch  Right;Left;2 reps;30 seconds    Lower Trunk Rotation  --    Prone on Elbows Stretch  30 seconds;3 reps    Quadruped Mid Back Stretch  30 seconds;2 reps fwd, lt, rt     Quad Stretch  Right;Left;2 reps;30 seconds    Piriformis Stretch  Right;Left;2 reps;30 seconds      Lumbar Exercises: Aerobic   Elliptical  5 min L 1.5      Lumbar Exercises: Standing   Row  Theraband 2X15    Theraband Level (Row)  Level 3 (Green)    Shoulder Extension  Theraband  2X15    Theraband Level (Shoulder Extension)  Level 3 (Green)      Lumbar Exercises: Supine   Dead Bug  15 reps    Bridge  5 seconds;20 reps    Straight Leg Raise  15 reps    Isometric Hip Flexion  --      Lumbar Exercises: Quadruped   Straight Leg Raise  --    Opposite Arm/Leg Raise  10 reps    Plank  modified to knees, 30 sec X  3      Cryotherapy   Number Minutes Cryotherapy  10 Minutes    Cryotherapy Location  Lumbar Spine    Type of Cryotherapy  Ice pack      Manual Therapy   Manual Therapy  Joint mobilization;Soft tissue mobilization    Joint Mobilization  grade 1 lumbar-thoroacic rotation mobs in SL    Soft tissue mobilization  STM to lumbar p.S                  PT Long Term Goals - 06/01/18 0935      PT LONG TERM GOAL #1   Title  Pt will be I and compliant with HEP. (6 weeks 07/01/18)    Status  On-going      PT LONG TERM GOAL #2   Title  Pt will increase foto to <37% limited to show improved functional abilites. (6 weeks 07/01/18)    Status  On-going      PT LONG TERM GOAL #3   Title  Pt will increase lumbar ROM to WNL to increase funciton. (6 weeks 07/01/18)    Status  Partially Met      PT LONG TERM GOAL #4   Title  Pt will reduce overall pain to no more than 2/10 for ADL's and work activities. (6 weeks  07/01/18)    Status  On-going            Plan - 06/10/18 0936    Clinical Impression Statement  Session focused on lumbar/LE stretching and core strengthening. He has made improvements in this area but still has deficits, see updated measurements. STM again performed with lumbar mobs to further increase ROM. PT will continue to progress as able.    Rehab Potential  Good    PT Frequency  2x / week    PT Duration  6 weeks    PT Treatment/Interventions  ADLs/Self Care Home Management;Cryotherapy;Electrical Stimulation;Iontophoresis 38m/ml Dexamethasone;Moist Heat;Traction;Ultrasound;Therapeutic activities;Therapeutic exercise;Neuromuscular re-education;Manual techniques;Dry needling    PT Next Visit Plan  progress core, continue stretching    Consulted and Agree with Plan of Care  Patient       Patient will benefit from skilled therapeutic intervention in order to improve the following deficits and impairments:  Decreased activity tolerance, Decreased range of motion, Decreased strength, Hypomobility, Impaired flexibility  Visit Diagnosis: Chronic bilateral low back pain without sciatica  Degeneration of lumbar intervertebral disc     Problem List Patient Active Problem List   Diagnosis Date Noted  . Degenerative disc disease, lumbar 05/12/2018  . Paresthesia 09/05/2014  . Migraine   . OTHER DISEASES OF NASAL CAVITY AND SINUSES 08/15/2008    BDebbe Odea PT, DPT 06/10/2018, 9:38 AM  [facility] [address] [address]Phone: [phone]  Fax:  [phone] Name: Michael SehgalMRN: [id number] of Birth: [birth year]

## 2018-06-17 ENCOUNTER — Ambulatory Visit: Payer: 59 | Admitting: Physical Therapy

## 2018-06-17 DIAGNOSIS — M5136 Other intervertebral disc degeneration, lumbar region: Secondary | ICD-10-CM | POA: Diagnosis not present

## 2018-06-17 DIAGNOSIS — M545 Low back pain: Secondary | ICD-10-CM | POA: Diagnosis not present

## 2018-06-17 DIAGNOSIS — G8929 Other chronic pain: Secondary | ICD-10-CM | POA: Diagnosis not present

## 2018-06-17 NOTE — Therapy (Signed)
[facility] [address] [place], [place], [address] Phone: [phone]   Fax:  [phone]  Physical Therapy Treatment  Patient Details  Name: Michael Collier MRN: [id number] Date of Birth: 12/16/1980 Referring Provider: Dr. Karie Schwalbe   Encounter Date: 06/17/2018  PT End of Session - 06/17/18 1109    Visit Number  6    Number of Visits  12    Date for PT Re-Evaluation  07/01/18    PT Start Time  0930    PT Stop Time  1025    PT Time Calculation (min)  55 min    Activity Tolerance  Patient tolerated treatment well;No increased pain    Behavior During Therapy  WFL for tasks assessed/performed       Past Medical History:  Diagnosis Date  . Migraine     Past Surgical History:  Procedure Laterality Date  . None      There were no vitals filed for this visit.  Subjective Assessment - 06/17/18 1042    Subjective  Pt relays he still has back stiffness in the morning and some Rt sided back pain/tghtness                        [facility] Adult PT Treatment/Exercise - 06/17/18 1103      Neuro Re-ed    Neuro Re-ed Details   core strengthening      Lumbar Exercises: Stretches   Passive Hamstring Stretch  Right;Left;2 reps;30 seconds    Prone on Elbows Stretch  30 seconds;3 reps    Quadruped Mid Back Stretch  30 seconds;2 reps    Quad Stretch  Right;Left;2 reps;30 seconds    Piriformis Stretch  Right;Left;2 reps;30 seconds      Lumbar Exercises: Aerobic   Elliptical  5 min      Lumbar Exercises: Standing   Row  Theraband    Theraband Level (Row)  Level 3 (Green)    Shoulder Extension  Theraband    Theraband Level (Shoulder Extension)  Level 3 (Green)    Other Standing Lumbar Exercises  anti rotation/piloff press green X 15 bilat      Lumbar Exercises: Supine   Dead Bug  --    Bridge  5 seconds;20 reps    Straight Leg Raise  --      Lumbar Exercises: Sidelying   Other Sidelying Lumbar Exercises  side  plank from knees 20 secX2 bilat      Lumbar Exercises: Quadruped   Opposite Arm/Leg Raise  10 reps    Plank  progressed to full plank 15 sec X 4      Cryotherapy   Number Minutes Cryotherapy  10 Minutes    Cryotherapy Location  Lumbar Spine    Type of Cryotherapy  Ice pack      Manual Therapy   Manual Therapy  Joint mobilization;Soft tissue mobilization    Joint Mobilization  grade 3 lumbar central PA mobs    Soft tissue mobilization  STM to lumbar p.S                         Plan - 06/17/18 1110    Clinical Impression Statement  Pt was able to progress his core strengthening program from modified front plank on knees to full plank. Also side planks from knees and standing anti-rotation exercise was added into program with good toerance. Stretching program resumed and he is making progress  in this area. PT will contiue to progress toward functional goals.     Rehab Potential  Good    PT Frequency  2x / week    PT Duration  6 weeks    PT Treatment/Interventions  ADLs/Self Care Home Management;Cryotherapy;Electrical Stimulation;Iontophoresis 4mg /ml Dexamethasone;Moist Heat;Traction;Ultrasound;Therapeutic activities;Therapeutic exercise;Neuromuscular re-education;Manual techniques;Dry needling    PT Next Visit Plan  progress core, continue stretching    Consulted and Agree with Plan of Care  Patient       Patient will benefit from skilled therapeutic intervention in order to improve the following deficits and impairments:  Decreased activity tolerance, Decreased range of motion, Decreased strength, Hypomobility, Impaired flexibility  Visit Diagnosis: Chronic bilateral low back pain without sciatica  Degeneration of lumbar intervertebral disc     Problem List Patient Active Problem List   Diagnosis Date Noted  . Degenerative disc disease, lumbar 05/12/2018  . Paresthesia 09/05/2014  . Migraine   . OTHER DISEASES OF NASAL CAVITY AND SINUSES 08/15/2008     April Manson, PT, DPT 06/17/2018, 11:12 AM  [facility] [address] [place], [place], [address] Phone: [phone]   Fax:  [phone]  Name: Michael Collier MRN: [id number] Date of Birth: 1980-12-31

## 2018-06-24 ENCOUNTER — Ambulatory Visit: Payer: 59 | Admitting: Physical Therapy

## 2018-06-24 DIAGNOSIS — G8929 Other chronic pain: Secondary | ICD-10-CM | POA: Diagnosis not present

## 2018-06-24 DIAGNOSIS — M5136 Other intervertebral disc degeneration, lumbar region: Secondary | ICD-10-CM | POA: Diagnosis not present

## 2018-06-24 DIAGNOSIS — M545 Low back pain, unspecified: Secondary | ICD-10-CM

## 2018-06-24 NOTE — Therapy (Addendum)
Michael Collier, [place], [address] Phone: [phone]   Fax:  [phone]  Physical Therapy Treatment/Discharge  Patient Details  Name: Michael Collier: [id number] Date of Birth: 1981/02/01 Referring Provider: Dr. Darene Lamer   Encounter Date: 06/24/2018  PT End of Session - 06/24/18 0929    Visit Number  7    Number of Visits  12    Date for PT Re-Evaluation  07/01/18    PT Start Time  0845    PT Stop Time  0940   last 10 min on ice   PT Time Calculation (min)  55 min    Activity Tolerance  Patient tolerated treatment well;No increased pain    Behavior During Therapy  WFL for tasks assessed/performed       Past Medical History:  Diagnosis Date  . Migraine     Past Surgical History:  Procedure Laterality Date  . None      There were no vitals filed for this visit.      [facility] PT Assessment - 06/24/18 0935      Assessment   Medical Diagnosis  LBP with DDD    Referring Provider  Dr. Darene Lamer    Onset Date/Surgical Date  04/20/18      AROM   AROM Assessment Site  Lumbar    Lumbar Flexion  90%    Lumbar Extension  WNL    Lumbar - Right Side Bend  WNL    Lumbar - Left Side Bend  WNL    Lumbar - Right Rotation  WNL    Lumbar - Left Rotation  WNL      Strength   Overall Strength  --   core strength improving can hold full plank 20 sec X 3     Flexibility   Soft Tissue Assessment /Muscle Length  --   improvements to now only mild tightness in quads, mod in H.S     Ambulation/Gait   Gait Comments  WFL gait pattern                   [facility] Adult PT Treatment/Exercise - 06/24/18 0935      Neuro Re-ed    Neuro Re-ed Details   core strengthening      Lumbar Exercises: Stretches   Passive Hamstring Stretch  Right;Left;2 reps;30 seconds    Lower Trunk Rotation  10 seconds;3 reps    Prone on Elbows Stretch  --   progressed to press ups   Press Ups  20 reps    Quadruped Mid Back  Stretch  30 seconds;2 reps   fwd, lt, rt    Quad Stretch  Right;Left;2 reps;30 seconds    Piriformis Stretch  Right;Left;2 reps;30 seconds      Lumbar Exercises: Aerobic   Elliptical  5 min   L 1.5     Lumbar Exercises: Supine   Bridge  5 seconds;20 reps      Lumbar Exercises: Sidelying   Other Sidelying Lumbar Exercises  side plank from knees 20 secX2 bilat      Lumbar Exercises: Quadruped   Opposite Arm/Leg Raise  10 reps    Plank  progressed to full plank 20 sec X 3      Modalities   Modalities  Cryotherapy      Cryotherapy   Number Minutes Cryotherapy  10 Minutes    Cryotherapy Location  Lumbar Spine      Manual Therapy   Manual Therapy  Joint mobilization;Soft tissue mobilization    Joint Mobilization  grade 3 lumbar central PA mobs    Soft tissue mobilization  STM to lumbar p.S                  PT Long Term Goals - 06/24/18 1015      PT LONG TERM GOAL #1   Title  Pt will be I and compliant with HEP. (6 weeks 07/01/18)    Status  Achieved      PT LONG TERM GOAL #2   Title  Pt will increase foto to <37% limited to show improved functional abilites. (6 weeks 07/01/18)    Status  On-going      PT LONG TERM GOAL #3   Title  Pt will increase lumbar ROM to WNL to increase funciton. (6 weeks 07/01/18)    Status  Achieved      PT LONG TERM GOAL #4   Title  Pt will reduce overall pain to no more than 2/10 for ADL's and work activities. (6 weeks 07/01/18)    Status  Achieved            Plan - 06/24/18 1002    Clinical Impression Statement  Pt has made excellent progress with pain, ROM, core strength, and lumbar/LE tighntess. He is very close to D/C due to progress. His POC ends next week and PT anticipates D/C if he returns. Pt states he feels great and may not return but PT has given him the option to if he needs it.    Rehab Potential  Good    PT Frequency  2x / week    PT Duration  6 weeks    PT Treatment/Interventions  ADLs/Self Care Home  Management;Cryotherapy;Electrical Stimulation;Iontophoresis 30m/ml Dexamethasone;Moist Heat;Traction;Ultrasound;Therapeutic activities;Therapeutic exercise;Neuromuscular re-education;Manual techniques;Dry needling    PT Next Visit Plan  likely DC, update HEP with more core, FOTO    Consulted and Agree with Plan of Care  Patient       Patient will benefit from skilled therapeutic intervention in order to improve the following deficits and impairments:  Decreased activity tolerance, Decreased range of motion, Decreased strength, Hypomobility, Impaired flexibility  Visit Diagnosis: Chronic bilateral low back pain without sciatica  Degeneration of lumbar intervertebral disc     Problem List Patient Active Problem List   Diagnosis Date Noted  . Degenerative disc disease, lumbar 05/12/2018  . Paresthesia 09/05/2014  . Migraine   . OTHER DISEASES OF NASAL CAVITY AND SINUSES 08/15/2008    BDebbe Collier PT, DPT 06/24/2018, 10:17 AM  [facility] [address]Phone: [phone]  Fax:  [phone] Name: Michael ArguijoMRN: [id number] of Birth: [birth year]  PHYSICAL THERAPY DISCHARGE SUMMARY  Visits from Start of Care: 7 Current functional level related to goals / functional outcomes: See above   Remaining deficits: See above   Education / Equipment: Continue HEP Plan: Patient agrees to discharge.  Patient goals were partially met. Patient is being discharged due to being pleased with the current functional level.  ?????     BElsie Ra PT, DPT 07/08/18 10:16 AM

## 2018-06-30 ENCOUNTER — Telehealth: Payer: Self-pay | Admitting: Physical Therapy

## 2018-06-30 NOTE — Telephone Encounter (Signed)
PT called and left message to patient regarding DC from PT to HEP.  Ivery QualeBrian Nelson, PT, DPT 06/30/18 2:10 PM

## 2018-10-19 DIAGNOSIS — R0683 Snoring: Secondary | ICD-10-CM | POA: Diagnosis not present

## 2018-10-19 DIAGNOSIS — J343 Hypertrophy of nasal turbinates: Secondary | ICD-10-CM | POA: Diagnosis not present

## 2018-10-19 DIAGNOSIS — J342 Deviated nasal septum: Secondary | ICD-10-CM | POA: Diagnosis not present

## 2019-10-25 ENCOUNTER — Telehealth: Payer: Self-pay

## 2019-10-27 NOTE — Telephone Encounter (Signed)
Entered in Error

## 2019-12-26 ENCOUNTER — Ambulatory Visit: Payer: 59 | Attending: Internal Medicine

## 2019-12-26 DIAGNOSIS — Z23 Encounter for immunization: Secondary | ICD-10-CM | POA: Insufficient documentation

## 2019-12-26 NOTE — Progress Notes (Signed)
   Covid-19 Vaccination Clinic  Name:  Michael Collier    MRN: [id number] DOB: 12-22-1980  12/26/2019  Michael Collier was observed post Covid-19 immunization for 15 minutes without incidence. He was provided with Vaccine Information Sheet and instruction to access the V-Safe system.   Michael Collier was instructed to call 911 with any severe reactions post vaccine: Marland Kitchen Difficulty breathing  . Swelling of your face and throat  . A fast heartbeat  . A bad rash all over your body  . Dizziness and weakness    Immunizations Administered    Name Date Dose VIS Date Route   Pfizer COVID-19 Vaccine 12/26/2019  6:19 PM 0.3 mL 10/21/2019 Intramuscular   Manufacturer: [employer], [employer]   Lot: [id number]   NDC: [address]-8

## 2020-01-17 ENCOUNTER — Ambulatory Visit: Payer: 59 | Attending: Internal Medicine

## 2020-01-17 DIAGNOSIS — Z23 Encounter for immunization: Secondary | ICD-10-CM

## 2020-01-17 NOTE — Progress Notes (Signed)
   Covid-19 Vaccination Clinic  Name:  Zaron Zwiefelhofer    MRN: [id number] DOB: 1981-08-08  01/17/2020  Mr. Pedrosa was observed post Covid-19 immunization for 15 minutes without incident. He was provided with Vaccine Information Sheet and instruction to access the V-Safe system.   Mr. Whidbee was instructed to call 911 with any severe reactions post vaccine: Marland Kitchen Difficulty breathing  . Swelling of face and throat  . A fast heartbeat  . A bad rash all over body  . Dizziness and weakness   Immunizations Administered    Name Date Dose VIS Date Route   Pfizer COVID-19 Vaccine 01/17/2020 11:10 AM 0.3 mL 10/21/2019 Intramuscular   Manufacturer: [employer], [employer]   Lot: [id number]   NDC: [address]-9

## 2020-01-18 ENCOUNTER — Ambulatory Visit: Payer: 59

## 2020-02-01 IMAGING — MR MR LUMBAR SPINE W/O CM
4 of 5 series · 29 of 48 positions shown · non-contrast
Comparison: None.

CLINICAL DATA: Low back pain for several years. Query axial versus
facetogenic source.

EXAM:
MRI LUMBAR SPINE WITHOUT CONTRAST
TECHNIQUE: Multiplanar, multisequence MR imaging of the lumbar spine was
performed. No intravenous contrast was administered.

[Series 2: T2 · sagittal · 4.0mm · 0.81mm/px · 7 of 15 slices shown (1 of 2)]
[im 1/15]
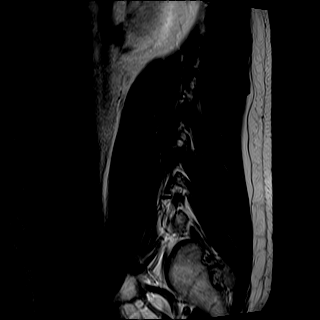
[im 3/15]
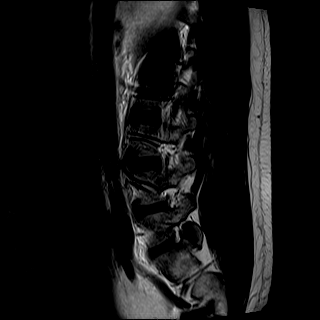
[im 5/15]
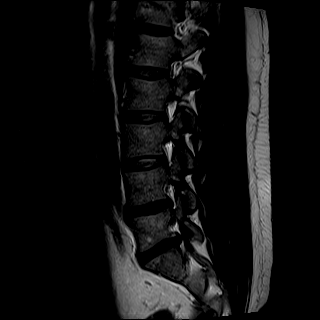
[im 8/15]
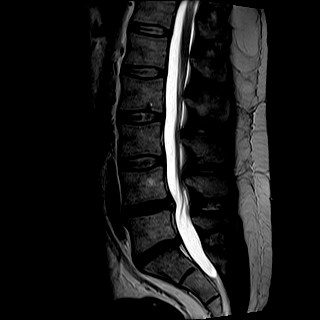
[im 10/15]
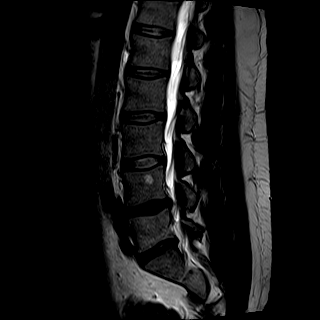
[im 12/15]
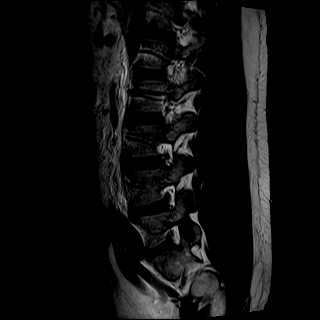
[im 15/15]
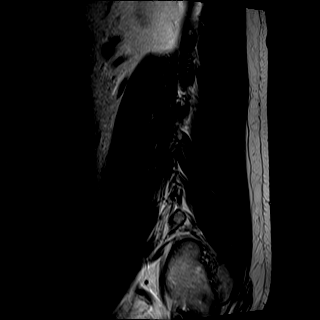

[Series 3: T1 · sagittal · 4.0mm · 0.81mm/px · 7 of 15 slices shown (1 of 2)]
[im 1/15]
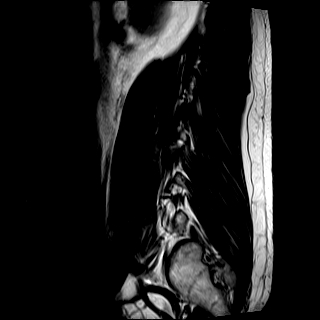
[im 3/15]
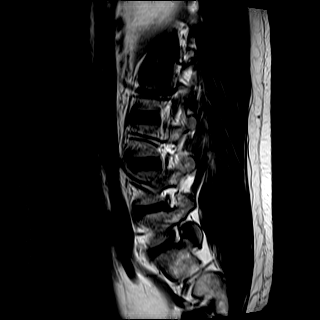
[im 5/15]
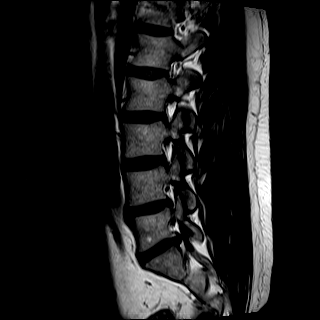
[im 8/15]
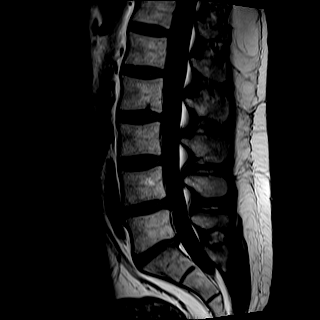
[im 10/15]
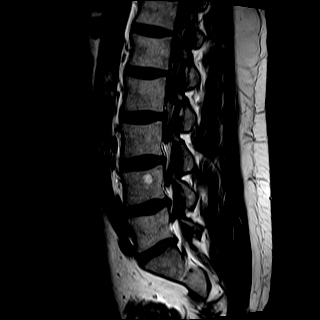
[im 12/15]
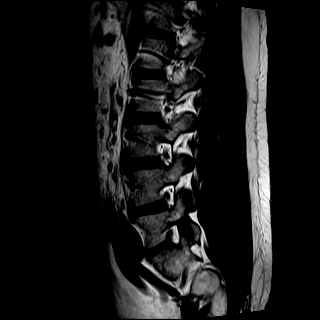
[im 15/15]
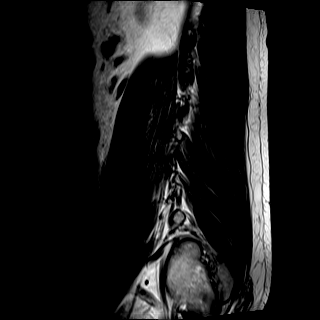

[Series 5: T2 · axial · 4.0mm · 0.39mm/px · z∈[-67,+126]mm · 8 of 34 slices shown (2 of 2)]
[im 1/34]
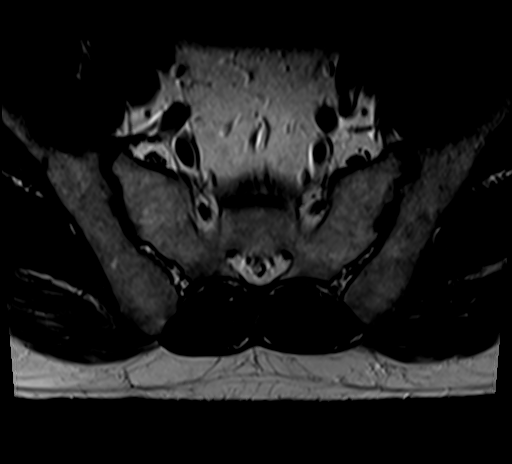
[im 6/34]
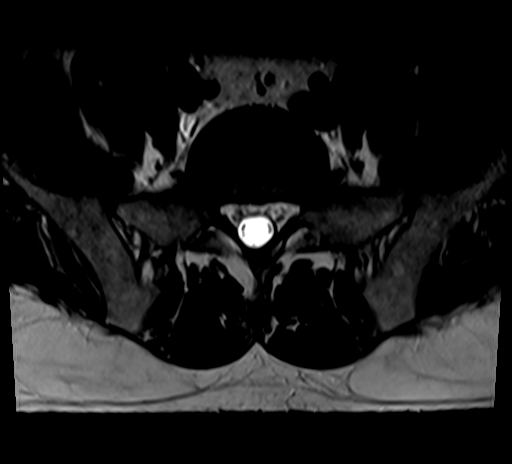
[im 11/34]
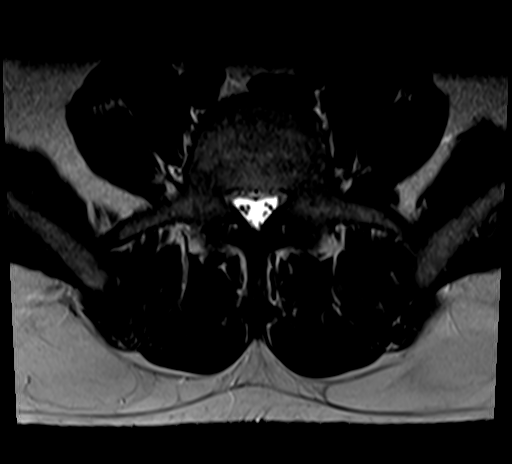
[im 16/34]
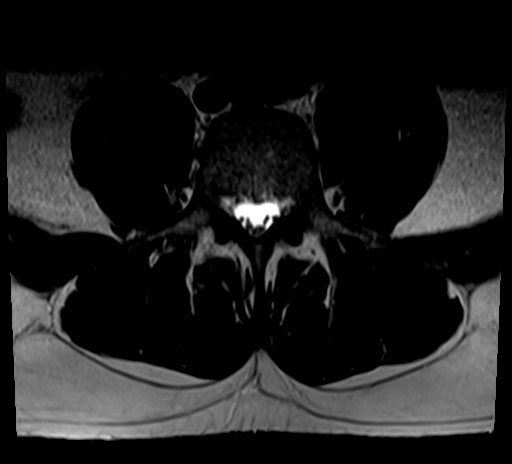
[im 18/34]
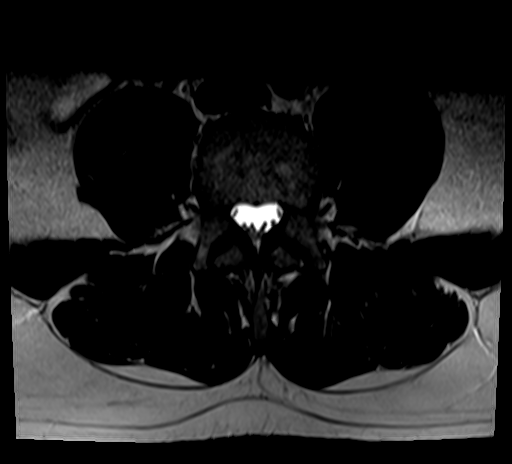
[im 23/34]
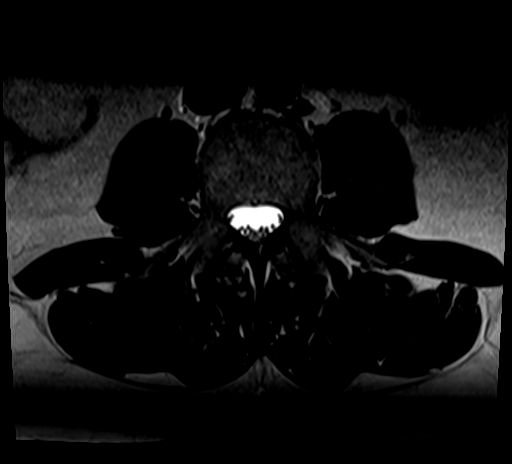
[im 28/34]
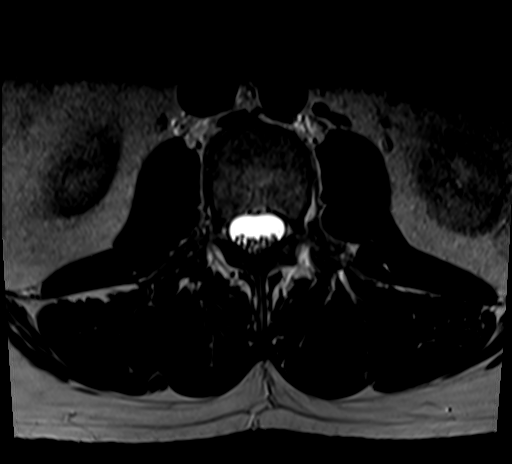
[im 34/34]
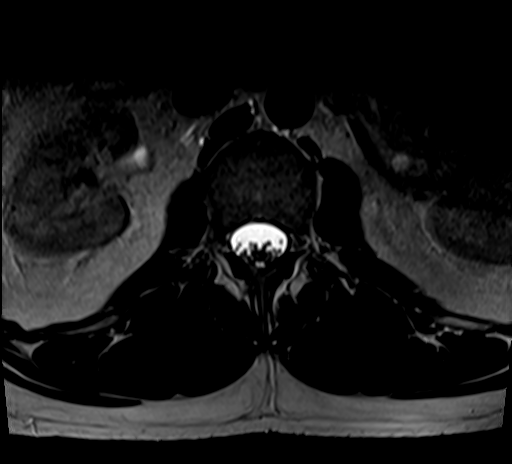

[Series 6: T1 · axial · 4.0mm · 0.39mm/px · z∈[-66,+95]mm · 7 of 34 slices shown (2 of 2)]
[im 1/34]
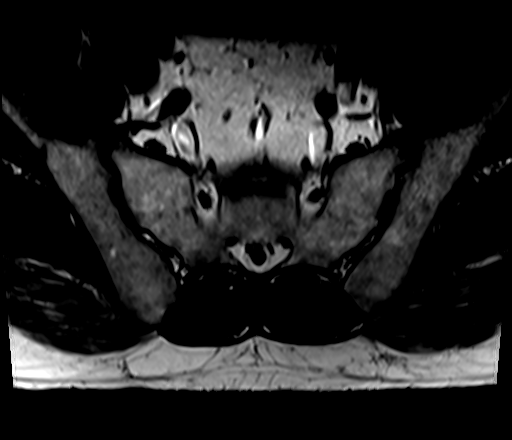
[im 6/34]
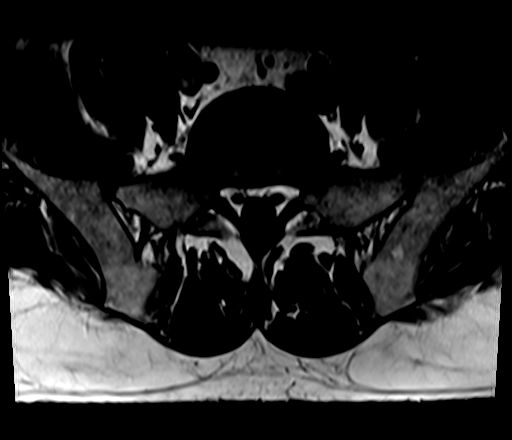
[im 11/34]
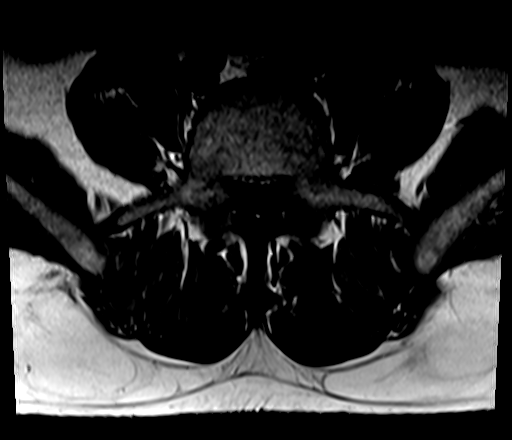
[im 16/34]
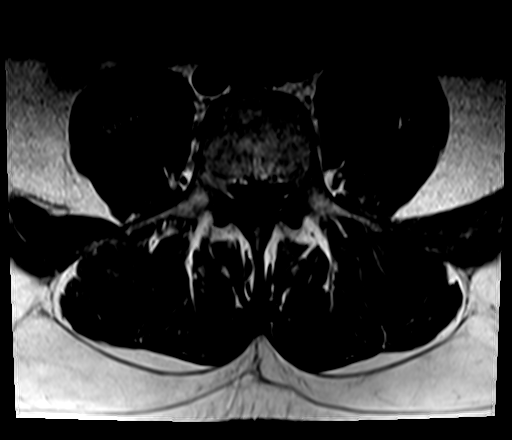
[im 18/34]
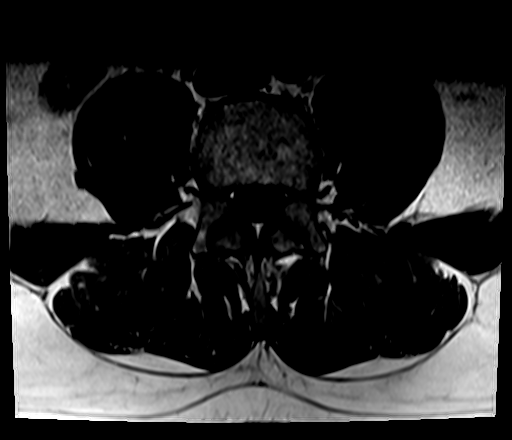
[im 23/34]
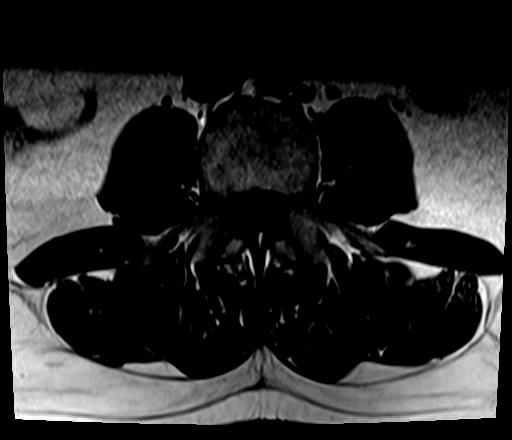
[im 28/34]
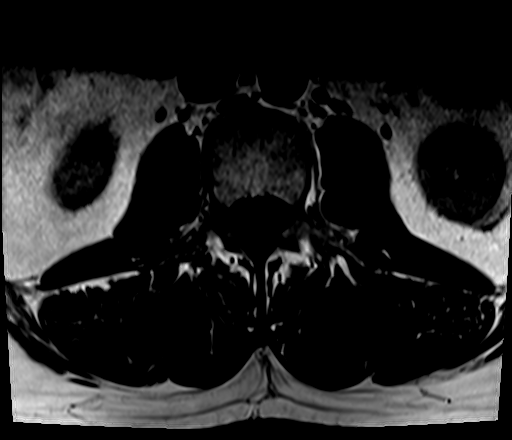

[29 of 48 positions shown; findings below may reference images not displayed]

FINDINGS: Segmentation:  Standard.

Alignment:  Anatomic

Vertebrae: No worrisome osseous lesion. No endplate reactive changes
or edema.

Conus medullaris and cauda equina: Conus extends to the L1 level.
Conus and cauda equina appear normal.

Paraspinal and other soft tissues: No renal abnormality. No
paravertebral mass. Mildly prominent interspinous bursa posteriorly,
see series 4, image 8.

Disc levels:

L1-L2:  Normal.

L2-L3:  Normal.

L3-L4:  Normal.

L4-L5: Disc space narrowing and desiccation. Central protrusion.
Facet arthropathy and ligamentum flavum hypertrophy. Mild to
moderate stenosis. BILATERAL subarticular zone narrowing could
affect either L5 nerve root. No foraminal narrowing of significance.

L5-S1: Disc space narrowing and desiccation. Shallow central
protrusion. Mild facet arthropathy. No ligamentum flavum
hypertrophy. Slight osseous spurring to the RIGHT. No compressive
subarticular zone or foraminal zone narrowing.
IMPRESSION: Disc space narrowing, desiccation, and protrusions at both L4-5 and
L5-S1.

Mild to moderate stenosis at L4-5 is the dominant finding, due to
combination of central protrusion and posterior element hypertrophy.
Potentially symptomatic subarticular zone narrowing could affect
either L5 nerve root. Mild interspinous bursa edema at this level,
uncertain significance.

## 2020-02-23 ENCOUNTER — Encounter: Payer: Self-pay | Admitting: Family Medicine

## 2020-02-23 ENCOUNTER — Other Ambulatory Visit: Payer: Self-pay

## 2020-02-23 ENCOUNTER — Ambulatory Visit (INDEPENDENT_AMBULATORY_CARE_PROVIDER_SITE_OTHER): Payer: 59 | Admitting: Family Medicine

## 2020-02-23 VITALS — BP 146/90 | HR 88 | Temp 98.3°F | Ht 70.0 in | Wt 238.0 lb

## 2020-02-23 DIAGNOSIS — E785 Hyperlipidemia, unspecified: Secondary | ICD-10-CM | POA: Insufficient documentation

## 2020-02-23 DIAGNOSIS — K219 Gastro-esophageal reflux disease without esophagitis: Secondary | ICD-10-CM | POA: Diagnosis not present

## 2020-02-23 DIAGNOSIS — I10 Essential (primary) hypertension: Secondary | ICD-10-CM | POA: Insufficient documentation

## 2020-02-23 DIAGNOSIS — J309 Allergic rhinitis, unspecified: Secondary | ICD-10-CM | POA: Diagnosis not present

## 2020-02-23 NOTE — Assessment & Plan Note (Signed)
Stable.  Continue omeprazole 20mg daily

## 2020-02-23 NOTE — Patient Instructions (Addendum)
It was very nice to see you today!  Please keep up the good work with your diet and exercise.  We will keep an eye on your blood pressure for now.  Let me know if persistently 140/90 or higher over the next several weeks and next couple of months.  Please send a message in 4 to 6 weeks to let me know how your blood pressures are looking.  We will check blood work today.  Come back in 1 year for your next check up with blood work, or sooner if needed.  Take care, Dr [name]  Please try these tips to maintain a healthy lifestyle:   Eat at least 3 REAL meals and 1-2 snacks per day.  Aim for no more than 5 hours between eating.  If you eat breakfast, please do so within one hour of getting up.    Each meal should contain half fruits/vegetables, one quarter protein, and one quarter carbs (no bigger than a computer mouse)   Cut down on sweet beverages. This includes juice, soda, and sweet tea.     Drink at least 1 glass of water with each meal and aim for at least 8 glasses per day   Exercise at least 150 minutes every week.   h

## 2020-02-23 NOTE — Progress Notes (Signed)
   Michael Collier is a 39 y.o. male who presents today for an office visit.  He is a new patient.   Assessment/Plan:  Chronic Problems Addressed Today: Allergic rhinitis Continue Zyrtec 10 mg daily.  Essential hypertension Slightly above goal.  He has made good lifestyle changes over the last couple of months.  Discussed options with patient including watchful waiting versus starting medication.  Given the fact that he has had made several good changes the past couple of months and blood pressures are borderline, will continue to watch for now.  He will check in with me in a couple of months with his blood pressure readings.  Continue lifestyle modifications.  Dyslipidemia Check CBC, C met, TSH, lipid panel.  GERD (gastroesophageal reflux disease) Stable.  Continue omeprazole 36m daily.    Subjective:  HPI:  Patient here to establish care.  Insurance recently changed and he has had to switch PCPs due to his previous PCP no longer being in network.  He has a few chronic conditions that seem to be well controlled.  He has been checking his blood pressure readings over the last few months.  Typically in the 130s to 140s over 80s to 90s.  Patient thinks he has some element of whitecoat hypertension has becomes particular stress every time he checks his blood pressure.  No reported chest pain or shortness of breath there is no rebound any medications in the past.  Both of his parents are on blood pressure medications.  He has been working hard on lifestyle changes over last couple months.  Is been working on eating healthier and going to the gym more often.       Objective:  Physical Exam: BP (!) 146/90 (BP Location: Left Arm, Patient Position: Sitting, Cuff Size: Large)   Pulse 88   Temp 98.3 F (36.8 C) (Temporal)   Ht _0  (1.778 m)   Wt 238 lb (108 kg)   SpO2 96%   BMI 34.15 kg/m   Gen: No acute distress, resting comfortably CV: Regular rate and rhythm with no murmurs  appreciated Pulm: Normal work of breathing, clear to auscultation bilaterally with no crackles, wheezes, or rhonchi Neuro: Grossly normal, moves all extremities Psych: Normal affect and thought content      Dakota Vanwart M. PJerline Pain MD 02/23/2020 3:20 PM

## 2020-02-23 NOTE — Assessment & Plan Note (Signed)
Check CBC, C met, TSH, lipid panel. 

## 2020-02-23 NOTE — Assessment & Plan Note (Signed)
Slightly above goal.  He has made good lifestyle changes over the last couple of months.  Discussed options with patient including watchful waiting versus starting medication.  Given the fact that he has had made several good changes the past couple of months and blood pressures are borderline, will continue to watch for now.  He will check in with me in a couple of months with his blood pressure readings.  Continue lifestyle modifications.

## 2020-02-23 NOTE — Assessment & Plan Note (Signed)
Continue Zyrtec 10 mg daily

## 2020-02-24 ENCOUNTER — Encounter: Payer: Self-pay | Admitting: Family Medicine

## 2020-02-24 DIAGNOSIS — R739 Hyperglycemia, unspecified: Secondary | ICD-10-CM | POA: Insufficient documentation

## 2020-02-24 LAB — COMPREHENSIVE METABOLIC PANEL
ALT: 46 U/L (ref 0–53)
AST: 23 U/L (ref 0–37)
Albumin: 4.7 g/dL (ref 3.5–5.2)
Alkaline Phosphatase: 103 U/L (ref 39–117)
BUN: 22 mg/dL (ref 6–23)
CO2: 26 mEq/L (ref 19–32)
Calcium: 9.5 mg/dL (ref 8.4–10.5)
Chloride: 105 mEq/L (ref 96–112)
Creatinine, Ser: 1 mg/dL (ref 0.40–1.50)
GFR: 82.47 mL/min (ref >60.00)
Glucose, Bld: 108 mg/dL — ABNORMAL HIGH (ref 70–99)
Potassium: 4.3 mEq/L (ref 3.5–5.1)
Sodium: 140 mEq/L (ref 135–145)
Total Bilirubin: 0.5 mg/dL (ref 0.2–1.2)
Total Protein: 7.1 g/dL (ref 6.0–8.3)

## 2020-02-24 LAB — CBC
HCT: 44.2 % (ref 39.0–52.0)
Hemoglobin: 15.1 g/dL (ref 13.0–17.0)
MCHC: 34.2 g/dL (ref 30.0–36.0)
MCV: 86.8 fl (ref 78.0–100.0)
Platelets: 183 10*3/uL (ref 150.0–400.0)
RBC: 5.09 Mil/uL (ref 4.22–5.81)
RDW: 14 % (ref 11.5–15.5)
WBC: 6 10*3/uL (ref 4.0–10.5)

## 2020-02-24 LAB — LIPID PANEL
Cholesterol: 193 mg/dL (ref 0–200)
HDL: 46.9 mg/dL (ref >39.00)
LDL Cholesterol: 114 mg/dL — ABNORMAL HIGH (ref 0–99)
NonHDL: 146.5
Total CHOL/HDL Ratio: 4
Triglycerides: 162 mg/dL — ABNORMAL HIGH (ref 0.0–149.0)
VLDL: 32.4 mg/dL (ref 0.0–40.0)

## 2020-02-24 LAB — TSH: TSH: 2.46 u[IU]/mL (ref 0.35–4.50)

## 2020-02-24 NOTE — Progress Notes (Signed)
Please inform patient of the following:  Blood sugar and cholesterol slightly elevated but everything else is stable. He does not need to start medications but recommend that he work on diet and exercise and we can recheck in a year or so.  Michael Collier. [name], MD 02/24/2020 12:50 PM

## 2021-02-11 ENCOUNTER — Other Ambulatory Visit: Payer: 59

## 2021-02-11 ENCOUNTER — Other Ambulatory Visit: Payer: Self-pay

## 2021-02-11 ENCOUNTER — Ambulatory Visit (INDEPENDENT_AMBULATORY_CARE_PROVIDER_SITE_OTHER): Payer: 59

## 2021-02-11 ENCOUNTER — Ambulatory Visit (INDEPENDENT_AMBULATORY_CARE_PROVIDER_SITE_OTHER): Payer: 59 | Admitting: Sports Medicine

## 2021-02-11 ENCOUNTER — Ambulatory Visit: Payer: 59 | Admitting: Sports Medicine

## 2021-02-11 DIAGNOSIS — M4807 Spinal stenosis, lumbosacral region: Secondary | ICD-10-CM | POA: Diagnosis not present

## 2021-02-11 DIAGNOSIS — M5136 Other intervertebral disc degeneration, lumbar region: Secondary | ICD-10-CM | POA: Diagnosis not present

## 2021-02-11 DIAGNOSIS — M51369 Other intervertebral disc degeneration, lumbar region without mention of lumbar back pain or lower extremity pain: Secondary | ICD-10-CM

## 2021-02-11 MED ORDER — PREDNISONE 50 MG PO TABS: ORAL_TABLET | ORAL | 0 refills | Status: DC

## 2021-02-11 MED ORDER — TRAMADOL HCL 50 MG PO TABS
50.0000 mg | ORAL_TABLET | Freq: Three times a day (TID) | ORAL | 0 refills | Status: DC | PRN
Start: 2021-02-11 — End: 2021-02-21

## 2021-02-11 NOTE — Progress Notes (Signed)
    Procedures performed today:    None.  Independent interpretation of notes and tests performed by another provider:   None.  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 He is having recurrence of pain, adding 5 days of prednisone, tramadol, I would like a second opinion from Dr. Yevette Edwards, he does have some difficulty with bowel and bladder function so we will go ahead and get an MRI hopefully today. Return to see me in 4 weeks.    ___________________________________________ [name]. Benjamin Stain, M.D., ABFM., CAQSM. Primary Care and Sports Medicine [facility]  Adjunct Instructor of Family Medicine  [employer]

## 2021-02-11 NOTE — Assessment & Plan Note (Signed)
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 He is having recurrence of pain, adding 5 days of prednisone, tramadol, I would like a second opinion from Dr. Yevette Edwards, he does have some difficulty with bowel and bladder function so we will go ahead and get an MRI hopefully today. Return to see me in 4 weeks.

## 2021-02-20 DIAGNOSIS — M5136 Other intervertebral disc degeneration, lumbar region: Secondary | ICD-10-CM

## 2021-02-20 DIAGNOSIS — M51369 Other intervertebral disc degeneration, lumbar region without mention of lumbar back pain or lower extremity pain: Secondary | ICD-10-CM

## 2021-02-21 MED ORDER — TRAMADOL HCL 50 MG PO TABS: 50.0000 mg | ORAL_TABLET | Freq: Three times a day (TID) | ORAL | 0 refills | Status: DC | PRN

## 2021-02-21 NOTE — Assessment & Plan Note (Signed)
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 Yevette Edwards, MRI showed dominant finding of broad-based L5-S1 disc protrusion, refilling tramadol and proceeding with right L5-S1 epidural as he is still having significant discomfort. He can return to see me 1 month after the injection.

## 2021-02-21 NOTE — Telephone Encounter (Signed)
I spent 5 total minutes of online digital evaluation and management services. 

## 2021-03-07 ENCOUNTER — Ambulatory Visit
Admission: RE | Admit: 2021-03-07 | Discharge: 2021-03-07 | Disposition: A | Discharge status: Home / Self Care | Payer: 59 | Source: Ambulatory Visit | Attending: Sports Medicine | Admitting: Sports Medicine

## 2021-03-07 ENCOUNTER — Other Ambulatory Visit: Payer: Self-pay

## 2021-03-07 DIAGNOSIS — M5136 Other intervertebral disc degeneration, lumbar region: Secondary | ICD-10-CM

## 2021-03-07 MED ORDER — IOPAMIDOL (ISOVUE-M 200) INJECTION 41%
1.0000 mL | Freq: Once | INTRAMUSCULAR | Status: AC
  Administered 2021-03-07: 1 mL via EPIDURAL

## 2021-03-07 MED ORDER — METHYLPREDNISOLONE ACETATE 40 MG/ML INJ SUSP (RADIOLOG
80.0000 mg | Freq: Once | INTRAMUSCULAR | Status: AC
  Administered 2021-03-07: 80 mg via EPIDURAL

## 2021-03-07 NOTE — Discharge Instructions (Signed)

## 2021-03-15 ENCOUNTER — Encounter: Payer: Self-pay | Admitting: Sports Medicine

## 2021-03-15 ENCOUNTER — Other Ambulatory Visit: Payer: Self-pay

## 2021-03-15 ENCOUNTER — Ambulatory Visit (INDEPENDENT_AMBULATORY_CARE_PROVIDER_SITE_OTHER): Payer: 59 | Admitting: Sports Medicine

## 2021-03-15 DIAGNOSIS — M51369 Other intervertebral disc degeneration, lumbar region without mention of lumbar back pain or lower extremity pain: Secondary | ICD-10-CM

## 2021-03-15 DIAGNOSIS — M5136 Other intervertebral disc degeneration, lumbar region: Secondary | ICD-10-CM

## 2021-03-15 NOTE — Assessment & Plan Note (Signed)
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 We tried prednisone and tramadol which were not effective, we got a second opinion from Dr. Yevette Edwards, MRI head showed a broad-based L5-S1 disc protrusion at the dominant finding, we did proceed with a right L5-S1 epidural and he returns today 98% pain-free. My advice to him is to hold off on any other intervention until he absolutely needs it. Return to see me on an as-needed basis, he can certainly just call back for another epidural if desired.

## 2021-03-15 NOTE — Progress Notes (Signed)
    Procedures performed today:    None.  Independent interpretation of notes and tests performed by another provider:   None.  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 We tried prednisone and tramadol which were not effective, we got a second opinion from Dr. Yevette Edwards, MRI head showed a broad-based L5-S1 disc protrusion at the dominant finding, we did proceed with a right L5-S1 epidural and he returns today 98% pain-free. My advice to him is to hold off on any other intervention until he absolutely needs it. Return to see me on an as-needed basis, he can certainly just call back for another epidural if desired.    ___________________________________________ [name]. Benjamin Stain, M.D., ABFM., CAQSM. Primary Care and Sports Medicine [facility]  Adjunct Instructor of Family Medicine  [employer]

## 2021-04-03 ENCOUNTER — Ambulatory Visit (HOSPITAL_BASED_OUTPATIENT_CLINIC_OR_DEPARTMENT_OTHER): Payer: 59 | Attending: Orthopedic Surgery | Admitting: Physical Therapy

## 2021-04-03 ENCOUNTER — Other Ambulatory Visit: Payer: Self-pay

## 2021-04-03 ENCOUNTER — Encounter (HOSPITAL_BASED_OUTPATIENT_CLINIC_OR_DEPARTMENT_OTHER): Payer: Self-pay | Admitting: Physical Therapy

## 2021-04-03 DIAGNOSIS — G8929 Other chronic pain: Secondary | ICD-10-CM | POA: Insufficient documentation

## 2021-04-03 DIAGNOSIS — M62830 Muscle spasm of back: Secondary | ICD-10-CM | POA: Diagnosis present

## 2021-04-03 DIAGNOSIS — M545 Low back pain, unspecified: Secondary | ICD-10-CM | POA: Diagnosis present

## 2021-04-04 ENCOUNTER — Encounter (HOSPITAL_BASED_OUTPATIENT_CLINIC_OR_DEPARTMENT_OTHER): Payer: Self-pay | Admitting: Physical Therapy

## 2021-04-04 NOTE — Therapy (Signed)
[facility] [address] [place], [place], [address] Phone: [phone]   Fax:  [phone]  Physical Therapy Evaluation  Patient Details  Name: Michael Collier MRN: [id number] Date of Birth: Apr 10, 1981 Referring Provider (PT): Dr Estill Bamberg   Encounter Date: 04/03/2021   PT End of Session - 04/03/21 1445    Visit Number 1    Number of Visits 6    Authorization Type [employer]    PT Start Time 1430    PT Stop Time 1512    PT Time Calculation (min) 42 min    Activity Tolerance Patient tolerated treatment well    Behavior During Therapy [facility] for tasks assessed/performed           Past Medical History:  Diagnosis Date  . Asthma   . Migraine     Past Surgical History:  Procedure Laterality Date  . None      There were no vitals filed for this visit.    Subjective Assessment - 04/03/21 1436    Subjective Patient has a long history of lower back pain. It was at its worst in February. He had an injection recently which has helped. He has pain in the morning and when he does too much activity.    Diagnostic tests MRI:L4-5: Diffuse disc bulge with disc desiccation and mild  intervertebral disc space narrowing. Superimposed central to right  subarticular disc protrusion with annular fissure mildly indents the  ventral thecal sac. Superimposed mild facet hypertrophy. Resultant  mild to moderate bilateral lateral recess narrowing, right worse  than left. Foramina remain patent. Central canal remains patent as  well.     L5-S1: Degenerative intervertebral disc space narrowing with diffuse  disc bulge and disc desiccation. Shallow central to right foraminal  disc protrusion indents the ventral thecal sac. Disc material  closely approximates both the exiting right L5 and descending S1  nerve roots, either which could be affected. Superimposed mild facet  hypertrophy. No significant canal or lateral recess stenosis.  Moderate right L5 foraminal  narrowing. Left neural foramen remains  patent.    Patient Stated Goals to have less pain    Currently in Pain? 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; stiff in the morning    Pain Relieving Factors rest    Effect of Pain on Daily Activities pain at times sitting in his car for work              [facility] PT Assessment - 04/04/21 0001      Assessment   Medical Diagnosis Low Back Pain    Referring Provider (PT) Dr Estill Bamberg    Onset Date/Surgical Date --   Several Years   Hand Dominance Right    Next MD Visit Nothing at this time    Prior Therapy had therapy 2 years ago with some improvement      Precautions   Precautions None      Restrictions   Weight Bearing Restrictions No      Balance Screen   Has the patient fallen in the past 6 months No    Has the patient had a  decrease in activity level because of a fear of falling?  No    Is the patient reluctant to leave their home because of a fear of falling?  No      Cognition   Overall Cognitive Status Within Functional Limits for tasks assessed    Attention Focused    Focused Attention Appears intact    Memory Appears intact    Awareness Appears intact    Problem Solving Appears intact      Observation/Other Assessments   Observations lateral shift to te left      Sensation   Light Touch Appears Intact      Coordination   Gross Motor Movements are Fluid and Coordinated Yes      AROM   Overall AROM Comments lateral shift to the left but no pain with lateral glides    AROM Assessment Site Lumbar    Lumbar Flexion 50 degrees with mild pain    Lumbar Extension pain at end range    Lumbar - Right Side Bend no pain    Lumbar - Left Side Bend no pain    Lumbar - Right Rotation  no pain    Lumbar - Left Rotation no pain      PROM   Overall PROM Comments limited hiop flexion bilateral but no pain end range      Strength   Right/Left Hip Right;Left    Right Hip Flexion 4+/5    Right Hip ABduction 4+/5    Right Hip ADduction 5/5    Left Hip Flexion 4+/5    Left Hip ABduction 4+/5    Left Hip ADduction 5/5      Palpation   Palpation comment tenderness to palpation in his lower lumbar spine and gluteals      Ambulation/Gait   Gait Comments no significant devications                      Objective measurements completed on examination: See above findings.               PT Education - 04/03/21 1444    Education Details reviewed transverse abdominal contraction; HEP, symptom mangement    Person(s) Educated Patient    Methods Explanation;Demonstration;Tactile cues;Verbal cues    Comprehension Verbalized understanding;Returned demonstration;Verbal cues required;Tactile cues required            PT Short Term Goals - 04/04/21 1348      PT SHORT TERM GOAL #1   Title Patient will demonstrate full lumbar spine mobility without pain    Time 3    Period Weeks    Status New    Target Date 04/25/21      PT SHORT TERM GOAL #2   Title Patient will increase bilateral LE strength to 5/5    Time 3    Period Weeks    Status New    Target Date 04/25/21      PT SHORT TERM GOAL #3   Title Patient will be independent with base HEP    Time 3    Period Weeks    Status New    Target Date 04/25/21             PT Long Term Goals - 04/04/21 1349      PT LONG TERM GOAL #1   Title Patient will return to the gym without increased pain    Time 6    Period Weeks    Status New  Target Date 05/16/21      PT LONG TERM GOAL #2   Title Patient will sit in his car without pain in order to perfrom work tasks    Time 6    Period Weeks    Status New    Target Date 05/16/21      PT LONG TERM GOAL #3   Title Patient will report no pain  or stiffness in the morning when he wakes up    Time 6    Period Weeks    Status New    Target Date 05/16/21                  Plan - 04/04/21 1341    Clinical Impression Statement Patient is a 40 year old male with a long history of lower back pain. he has been in his current exacerbation since January. He had an injection which has helped his pain significnanly. Hehad spasming in his lower back and into bilateral upper gluteals. He had increased pain with end range rotation and extension. he had a mild motion deficits with forward lumbar flexion. He also has minor bilateral strength deficits. His MRI shows a disck buldge and also end plate degeneration. He would benefit from skilled therapy to improve his ability to perfrom walk tasks and to improve pain and stiffness in the morning. This problem happens every few years. We will try to build him a program that will help him be able to reduce his pain and decrease the length of his exacerbation when his back flairs up.    Personal Factors and Comorbidities Profession;Comorbidity 1    Comorbidities history of migraines    Examination-Activity Limitations Sit;Stand    Examination-Participation Restrictions Cleaning;Shop;Occupation    Stability/Clinical Decision Making Evolving/Moderate complexity   pain is decreasing but still is at hight levels given the amount of time he has been in pain   Clinical Decision Making Moderate    Rehab Potential Good    PT Frequency 2x / week    PT Duration 6 weeks    PT Treatment/Interventions ADLs/Self Care Home Management;Canalith Repostioning;Electrical Stimulation;Moist Heat;Ultrasound;Traction;Cryotherapy;DME Instruction;Balance training;Therapeutic exercise;Neuromuscular re-education;Patient/family education;Manual techniques;Passive range of motion;Spinal Manipulations    PT Next Visit Plan consider manual therapy to lower lumbar spine and LAD; review tolerance to exercises. Progress to gym exercises  as tolerated    PT Home Exercise Plan Access Code: [email or web address]  Prepared by: Lorayne Bender    Exercises  Supine Piriformis Stretch with Foot on Ground - 1 x daily - 7 x weekly - 3 sets - 10 reps  Standing Glute Med Mobilization with Small Ball on Wall - 1 x daily - 7 x weekly - 3 sets - 3 reps - 1-2 min hold  Seated Hamstring Stretch - 1 x daily - 7 x weekly - 3 sets - 3 reps - 20 hold  Shoulder extension with resistance - Neutral - 1 x daily - 7 x weekly - 3 sets - 10 reps  Scapular Retraction with Resistance - 1 x daily - 7 x weekly - 3 sets - 10 reps  Supine March - 1 x daily - 7 x weekly - 3 sets - 10 reps  Hooklying Clamshell with Resistance - 1 x daily - 7 x weekly - 3 sets - 10 reps    Consulted and Agree with Plan of Care Patient           Patient will benefit from skilled therapeutic intervention in order to improve  the following deficits and impairments:  Increased muscle spasms,Decreased safety awareness,Decreased activity tolerance,Pain,Increased fascial restricitons  Visit Diagnosis: Chronic bilateral low back pain without sciatica  Muscle spasm of back     Problem List Patient Active Problem List   Diagnosis Date Noted  . Hyperglycemia 02/24/2020  . GERD (gastroesophageal reflux disease) 02/23/2020  . Dyslipidemia 02/23/2020  . Essential hypertension 02/23/2020  . Allergic rhinitis 02/23/2020  . Degenerative disc disease, lumbar 05/12/2018  . Migraine     Dessie Comaavid J Aliyanah Rozas PT DPT  04/04/2021, 1:54 PM  [facility] [address] [place], [place], [address] Phone: [phone]   Fax:  [phone]  Name: Michael Collier MRN: [id number] Date of Birth: 04/13/1981

## 2021-04-04 NOTE — Patient Instructions (Signed)
Access Code: [email or web address] Prepared by: Lorayne Bender  Exercises Supine Piriformis Stretch with Foot on Ground - 1 x daily - 7 x weekly - 3 sets - 10 reps Standing Glute Med Mobilization with Small Ball on Wall - 1 x daily - 7 x weekly - 3 sets - 3 reps - 1-2 min hold Seated Hamstring Stretch - 1 x daily - 7 x weekly - 3 sets - 3 reps - 20 hold Shoulder extension with resistance - Neutral - 1 x daily - 7 x weekly - 3 sets - 10 reps Scapular Retraction with Resistance - 1 x daily - 7 x weekly - 3 sets - 10 reps Supine March - 1 x daily - 7 x weekly - 3 sets - 10 reps Hooklying Clamshell with Resistance - 1 x daily - 7 x weekly - 3 sets - 10 reps

## 2021-04-23 ENCOUNTER — Ambulatory Visit (HOSPITAL_BASED_OUTPATIENT_CLINIC_OR_DEPARTMENT_OTHER): Payer: 59 | Admitting: Physical Therapy

## 2021-05-02 ENCOUNTER — Ambulatory Visit (HOSPITAL_BASED_OUTPATIENT_CLINIC_OR_DEPARTMENT_OTHER): Payer: 59 | Attending: Orthopedic Surgery | Admitting: Physical Therapy

## 2021-05-02 ENCOUNTER — Other Ambulatory Visit: Payer: Self-pay

## 2021-05-02 DIAGNOSIS — G8929 Other chronic pain: Secondary | ICD-10-CM | POA: Insufficient documentation

## 2021-05-02 DIAGNOSIS — M62830 Muscle spasm of back: Secondary | ICD-10-CM | POA: Diagnosis present

## 2021-05-02 DIAGNOSIS — M545 Low back pain, unspecified: Secondary | ICD-10-CM | POA: Insufficient documentation

## 2021-05-02 NOTE — Therapy (Signed)
[facility] [address] [place], [place], [address] Phone: [phone]   Fax:  [phone]  Physical Therapy Treatment  Patient Details  Name: Michael Collier MRN: [id number] Date of Birth: 09/20/1981 Referring Provider (PT): Dr Estill Bamberg   Encounter Date: 05/02/2021   PT End of Session - 05/02/21 0824     Visit Number 2    Number of Visits 6    Date for PT Re-Evaluation 05/16/21    PT Start Time 0800    PT Stop Time 0840    PT Time Calculation (min) 40 min    Activity Tolerance Patient tolerated treatment well    Behavior During Therapy [facility] for tasks assessed/performed             Past Medical History:  Diagnosis Date   Asthma    Migraine     Past Surgical History:  Procedure Laterality Date   None      There were no vitals filed for this visit.   Subjective Assessment - 05/02/21 0805     Subjective Patient reports that he has not had much pain lately. He was training a few days ago and had some pain when he came home.    Diagnostic tests MRI:L4-5: Diffuse disc bulge with disc desiccation and mild  intervertebral disc space narrowing. Superimposed central to right  subarticular disc protrusion with annular fissure mildly indents the  ventral thecal sac. Superimposed mild facet hypertrophy. Resultant  mild to moderate bilateral lateral recess narrowing, right worse  than left. Foramina remain patent. Central canal remains patent as  well.     L5-S1: Degenerative intervertebral disc space narrowing with diffuse  disc bulge and disc desiccation. Shallow central to right foraminal  disc protrusion indents the ventral thecal sac. Disc material  closely approximates both the exiting right L5 and descending S1  nerve roots, either which could be affected. Superimposed mild facet  hypertrophy. No significant canal or lateral recess stenosis.  Moderate right L5 foraminal narrowing. Left neural foramen remains  patent.     Patient Stated Goals to have less pain    Currently in Pain? No/denies                               [facility] Adult PT Treatment/Exercise - 05/02/21 0001       Lumbar Exercises: Stretches   Piriformis Stretch Limitations 3x20 sec hold    Other Lumbar Stretch Exercise sink stretch straight and lateral 3x20 sec hold      Lumbar Exercises: Machines for Strengthening   Other Lumbar Machine Exercise row x20 25 lbs; trceps extension x25 pallof press x20 15 lbs each direction; chops x20 each direction      Manual Therapy   Manual Therapy Joint mobilization;Soft tissue mobilization    Joint Mobilization lower lumbar PA mobilizations    Soft tissue mobilization trigger point release to the left gluteal              Trigger Point Dry Needling - 05/02/21 0001     Consent Given? Yes    Education Handout Provided Yes    Muscles Treated Back/Hip Gluteus medius    Dry Needling Comments 3 spots needled in the gluteal. Mild teitch respose noted                  PT Education - 05/02/21 0823     Education Details updated HEP  Person(s) Educated Patient    Methods Explanation;Demonstration;Tactile cues;Verbal cues    Comprehension Verbalized understanding;Returned demonstration;Verbal cues required;Tactile cues required              PT Short Term Goals - 04/04/21 1348       PT SHORT TERM GOAL #1   Title Patient will demonstrate full lumbar spine mobility without pain    Time 3    Period Weeks    Status New    Target Date 04/25/21      PT SHORT TERM GOAL #2   Title Patient will increase bilateral LE strength to 5/5    Time 3    Period Weeks    Status New    Target Date 04/25/21      PT SHORT TERM GOAL #3   Title Patient will be independent with base HEP    Time 3    Period Weeks    Status New    Target Date 04/25/21               PT Long Term Goals - 04/04/21 1349       PT LONG TERM GOAL #1   Title Patient will return to the gym  without increased pain    Time 6    Period Weeks    Status New    Target Date 05/16/21      PT LONG TERM GOAL #2   Title Patient will sit in his car without pain in order to perfrom work tasks    Time 6    Period Weeks    Status New    Target Date 05/16/21      PT LONG TERM GOAL #3   Title Patient will report no pain or stiffness in the morning when he wakes up    Time 6    Period Weeks    Status New    Target Date 05/16/21                   Plan - 05/02/21 0826     Clinical Impression Statement Patient has a small tigger point in the left upper gluteal. Therapy peorfrmed tirgger point dry needling with a good twtch respose. We reviewed light stretching including a sink stretch. We also review light gym exercises for core stability. He had no increase in low back pain with treatment. We will continue to progress exercises as tolerated.    Personal Factors and Comorbidities Profession;Comorbidity 1    Comorbidities history of migraines    Examination-Participation Restrictions Cleaning;Shop;Occupation    Clinical Decision Making Moderate    Rehab Potential Good    PT Frequency 2x / week    PT Duration 6 weeks    PT Treatment/Interventions ADLs/Self Care Home Management;Canalith Repostioning;Electrical Stimulation;Moist Heat;Ultrasound;Traction;Cryotherapy;DME Instruction;Balance training;Therapeutic exercise;Neuromuscular re-education;Patient/family education;Manual techniques;Passive range of motion;Spinal Manipulations    PT Next Visit Plan consider manual therapy to lower lumbar spine and LAD; review tolerance to exercises. Progress to gym exercises as tolerated    PT Home Exercise Plan Access Code: [email or web address]  Prepared by: Lorayne Bender    Exercises  Supine Piriformis Stretch with Foot on Ground - 1 x daily - 7 x weekly - 3 sets - 10 reps  Standing Glute Med Mobilization with Small Ball on Wall - 1 x daily - 7 x weekly - 3 sets - 3 reps - 1-2 min hold  Seated Hamstring  Stretch - 1 x daily - 7 x weekly - 3 sets -  3 reps - 20 hold  Shoulder extension with resistance - Neutral - 1 x daily - 7 x weekly - 3 sets - 10 reps  Scapular Retraction with Resistance - 1 x daily - 7 x weekly - 3 sets - 10 reps  Supine March - 1 x daily - 7 x weekly - 3 sets - 10 reps  Hooklying Clamshell with Resistance - 1 x daily - 7 x weekly - 3 sets - 10 reps    Consulted and Agree with Plan of Care Patient             Patient will benefit from skilled therapeutic intervention in order to improve the following deficits and impairments:  Increased muscle spasms, Decreased safety awareness, Decreased activity tolerance, Pain, Increased fascial restricitons  Visit Diagnosis: Chronic bilateral low back pain without sciatica  Muscle spasm of back     Problem List Patient Active Problem List   Diagnosis Date Noted   Hyperglycemia 02/24/2020   GERD (gastroesophageal reflux disease) 02/23/2020   Dyslipidemia 02/23/2020   Essential hypertension 02/23/2020   Allergic rhinitis 02/23/2020   Degenerative disc disease, lumbar 05/12/2018   Migraine     Dessie Coma PT DPT  05/02/2021, 8:41 PM  [facility] [address] [place], [place], [address] Phone: [phone]   Fax:  [phone]  Name: Jayven Naill MRN: [id number] Date of Birth: [birth year]

## 2021-05-10 ENCOUNTER — Other Ambulatory Visit: Payer: Self-pay

## 2021-05-10 ENCOUNTER — Encounter (HOSPITAL_BASED_OUTPATIENT_CLINIC_OR_DEPARTMENT_OTHER): Payer: Self-pay | Admitting: Physical Therapy

## 2021-05-10 ENCOUNTER — Ambulatory Visit (HOSPITAL_BASED_OUTPATIENT_CLINIC_OR_DEPARTMENT_OTHER): Payer: 59 | Attending: Orthopedic Surgery | Admitting: Physical Therapy

## 2021-05-10 DIAGNOSIS — M62830 Muscle spasm of back: Secondary | ICD-10-CM | POA: Diagnosis present

## 2021-05-10 DIAGNOSIS — G8929 Other chronic pain: Secondary | ICD-10-CM | POA: Insufficient documentation

## 2021-05-10 DIAGNOSIS — M545 Low back pain, unspecified: Secondary | ICD-10-CM | POA: Insufficient documentation

## 2021-05-10 NOTE — Therapy (Signed)
[facility] [address] [place], [place], [address] Phone: [phone]   Fax:  [phone]  Physical Therapy Treatment  Patient Details  Name: Michael Collier MRN: [id number] Date of Birth: February 24, 1981 Referring Provider (PT): Dr Estill Bamberg   Encounter Date: 05/10/2021   PT End of Session - 05/10/21 1604     Visit Number 3    Number of Visits 6    Date for PT Re-Evaluation 05/16/21    Authorization Type UHC    PT Start Time 1515    PT Stop Time 1545   Patient did not require a full session   PT Time Calculation (min) 30 min    Activity Tolerance Patient tolerated treatment well    Behavior During Therapy [facility] for tasks assessed/performed             Past Medical History:  Diagnosis Date   Asthma    Migraine     Past Surgical History:  Procedure Laterality Date   None      There were no vitals filed for this visit.   Subjective Assessment - 05/10/21 1525     Subjective Patient hasn;t had pain sicne the needling. He is back in the gym.    Diagnostic tests MRI:L4-5: Diffuse disc bulge with disc desiccation and mild  intervertebral disc space narrowing. Superimposed central to right  subarticular disc protrusion with annular fissure mildly indents the  ventral thecal sac. Superimposed mild facet hypertrophy. Resultant  mild to moderate bilateral lateral recess narrowing, right worse  than left. Foramina remain patent. Central canal remains patent as  well.     L5-S1: Degenerative intervertebral disc space narrowing with diffuse  disc bulge and disc desiccation. Shallow central to right foraminal  disc protrusion indents the ventral thecal sac. Disc material  closely approximates both the exiting right L5 and descending S1  nerve roots, either which could be affected. Superimposed mild facet  hypertrophy. No significant canal or lateral recess stenosis.  Moderate right L5 foraminal narrowing. Left neural foramen remains   patent.    Patient Stated Goals to have less pain    Currently in Pain? No/denies    Pain Relieving Factors rest    Effect of Pain on Daily Activities pain at times getting in and out of the car.    Multiple Pain Sites No                               [facility] Adult PT Treatment/Exercise - 05/10/21 0001       Lumbar Exercises: Aerobic   Other Aerobic Exercise 5 min warm up on the bike      Lumbar Exercises: Standing   Other Standing Lumbar Exercises reviewed weights and technique with squats and deadlifts. We were going to review kettle bell swings but he has hurt his back with those. 75lbs smith machine 2x10; 65 free squat rack x10; deadlift 23 lbs form step 2x10      Manual Therapy   Manual therapy comments assessment of trigger points                    PT Education - 05/10/21 1604     Education Details reviewed lifting technique    Person(s) Educated Patient    Methods Explanation;Demonstration;Tactile cues;Verbal cues    Comprehension Verbalized understanding;Returned demonstration;Tactile cues required;Verbal cues required  PT Short Term Goals - 04/04/21 1348       PT SHORT TERM GOAL #1   Title Patient will demonstrate full lumbar spine mobility without pain    Time 3    Period Weeks    Status New    Target Date 04/25/21      PT SHORT TERM GOAL #2   Title Patient will increase bilateral LE strength to 5/5    Time 3    Period Weeks    Status New    Target Date 04/25/21      PT SHORT TERM GOAL #3   Title Patient will be independent with base HEP    Time 3    Period Weeks    Status New    Target Date 04/25/21               PT Long Term Goals - 04/04/21 1349       PT LONG TERM GOAL #1   Title Patient will return to the gym without increased pain    Time 6    Period Weeks    Status New    Target Date 05/16/21      PT LONG TERM GOAL #2   Title Patient will sit in his car without pain in order to  perfrom work tasks    Time 6    Period Weeks    Status New    Target Date 05/16/21      PT LONG TERM GOAL #3   Title Patient will report no pain or stiffness in the morning when he wakes up    Time 6    Period Weeks    Status New    Target Date 05/16/21                   Plan - 05/10/21 1608     Clinical Impression Statement Patient had no trigger points today. We reviewed two lifts with him. We reviewed progression of exercises and activity. He was advised that if he feels good next week he can cancel and we will put him on hold. If he needs therapy we will continue. He was advised if he flairs in 4-6 weeks to call and re-schedule.    Personal Factors and Comorbidities Profession;Comorbidity 1    Comorbidities history of migraines    Examination-Activity Limitations Sit;Stand    Examination-Participation Restrictions Cleaning;Shop;Occupation    Stability/Clinical Decision Making Evolving/Moderate complexity    Clinical Decision Making Moderate    Rehab Potential Good    PT Frequency 2x / week    PT Duration 6 weeks    PT Treatment/Interventions ADLs/Self Care Home Management;Canalith Repostioning;Electrical Stimulation;Moist Heat;Ultrasound;Traction;Cryotherapy;DME Instruction;Balance training;Therapeutic exercise;Neuromuscular re-education;Patient/family education;Manual techniques;Passive range of motion;Spinal Manipulations    PT Next Visit Plan consider manual therapy to lower lumbar spine and LAD; review tolerance to exercises. Progress to gym exercises as tolerated    PT Home Exercise Plan Access Code: [email or web address]  Prepared by: Lorayne Bender    Exercises  Supine Piriformis Stretch with Foot on Ground - 1 x daily - 7 x weekly - 3 sets - 10 reps  Standing Glute Med Mobilization with Small Ball on Wall - 1 x daily - 7 x weekly - 3 sets - 3 reps - 1-2 min hold  Seated Hamstring Stretch - 1 x daily - 7 x weekly - 3 sets - 3 reps - 20 hold  Shoulder extension with resistance -  Neutral - 1 x daily - 7  x weekly - 3 sets - 10 reps  Scapular Retraction with Resistance - 1 x daily - 7 x weekly - 3 sets - 10 reps  Supine March - 1 x daily - 7 x weekly - 3 sets - 10 reps  Hooklying Clamshell with Resistance - 1 x daily - 7 x weekly - 3 sets - 10 reps    Consulted and Agree with Plan of Care Patient             Patient will benefit from skilled therapeutic intervention in order to improve the following deficits and impairments:  Increased muscle spasms, Decreased safety awareness, Decreased activity tolerance, Pain, Increased fascial restricitons  Visit Diagnosis: Chronic bilateral low back pain without sciatica  Muscle spasm of back     Problem List Patient Active Problem List   Diagnosis Date Noted   Hyperglycemia 02/24/2020   GERD (gastroesophageal reflux disease) 02/23/2020   Dyslipidemia 02/23/2020   Essential hypertension 02/23/2020   Allergic rhinitis 02/23/2020   Degenerative disc disease, lumbar 05/12/2018   Migraine     Dessie Coma PT DPT  05/10/2021, 4:13 PM  [facility] [address] [place], [place], [address] Phone: [phone]   Fax:  [phone]  Name: Kincade Granberg MRN: [id number] Date of Birth: Feb 13, 1981

## 2021-05-14 ENCOUNTER — Other Ambulatory Visit: Payer: Self-pay

## 2021-05-14 ENCOUNTER — Encounter (HOSPITAL_BASED_OUTPATIENT_CLINIC_OR_DEPARTMENT_OTHER): Payer: Self-pay | Admitting: Physical Therapy

## 2021-05-14 ENCOUNTER — Ambulatory Visit (HOSPITAL_BASED_OUTPATIENT_CLINIC_OR_DEPARTMENT_OTHER): Payer: 59 | Admitting: Physical Therapy

## 2021-05-14 DIAGNOSIS — M62830 Muscle spasm of back: Secondary | ICD-10-CM

## 2021-05-14 DIAGNOSIS — M545 Low back pain, unspecified: Secondary | ICD-10-CM | POA: Diagnosis not present

## 2021-05-14 DIAGNOSIS — G8929 Other chronic pain: Secondary | ICD-10-CM

## 2021-05-15 ENCOUNTER — Encounter (HOSPITAL_BASED_OUTPATIENT_CLINIC_OR_DEPARTMENT_OTHER): Payer: Self-pay | Admitting: Physical Therapy

## 2021-05-15 NOTE — Therapy (Signed)
[facility] [address] [place], [place], [address] Phone: [phone]   Fax:  [phone]  Physical Therapy Treatment  Patient Details  Name: Michael Collier MRN: [id number] Date of Birth: Mar 31, 1981 Referring Provider (PT): Dr Estill Bamberg   Encounter Date: 05/14/2021   PT End of Session - 05/14/21 1605     Visit Number 4    Number of Visits 6    Date for PT Re-Evaluation 05/16/21    Authorization Type UHC    PT Start Time 1558    PT Stop Time 1638    PT Time Calculation (min) 40 min    Activity Tolerance Patient tolerated treatment well    Behavior During Therapy [facility] for tasks assessed/performed             Past Medical History:  Diagnosis Date   Asthma    Migraine     Past Surgical History:  Procedure Laterality Date   None      There were no vitals filed for this visit.   Subjective Assessment - 05/14/21 1603     Subjective Patient was sore over the last few days. he has been sore ver since his last visit. He has been trying to stretch ut it is still tight.    Diagnostic tests MRI:L4-5: Diffuse disc bulge with disc desiccation and mild  intervertebral disc space narrowing. Superimposed central to right  subarticular disc protrusion with annular fissure mildly indents the  ventral thecal sac. Superimposed mild facet hypertrophy. Resultant  mild to moderate bilateral lateral recess narrowing, right worse  than left. Foramina remain patent. Central canal remains patent as  well.     L5-S1: Degenerative intervertebral disc space narrowing with diffuse  disc bulge and disc desiccation. Shallow central to right foraminal  disc protrusion indents the ventral thecal sac. Disc material  closely approximates both the exiting right L5 and descending S1  nerve roots, either which could be affected. Superimposed mild facet  hypertrophy. No significant canal or lateral recess stenosis.  Moderate right L5 foraminal narrowing.  Left neural foramen remains  patent.    Patient Stated Goals to have less pain    Currently in Pain? Yes    Pain Score 4     Pain Location Back    Pain Orientation Right;Left    Pain Descriptors / Indicators Aching    Pain Type Chronic pain    Pain Onset 1 to 4 weeks ago    Pain Frequency Intermittent    Aggravating Factors  standing and walking    Pain Relieving Factors rest    Effect of Pain on Daily Activities pain with activity    Multiple Pain Sites No                                 Trigger Point Dry Needling - 05/15/21 0001     Consent Given? Yes    Education Handout Provided Yes    Muscles Treated Back/Hip Gluteus medius    Dry Needling Comments 3 spots needled in the righ gluteal. Mild teitch respose noted                  PT Education - 05/14/21 1604     Education Details stretching and HEP    Person(s) Educated Patient    Methods Explanation;Demonstration;Tactile cues;Verbal cues    Comprehension Verbalized understanding;Returned demonstration;Verbal cues required;Tactile cues required  PT Short Term Goals - 04/04/21 1348       PT SHORT TERM GOAL #1   Title Patient will demonstrate full lumbar spine mobility without pain    Time 3    Period Weeks    Status New    Target Date 04/25/21      PT SHORT TERM GOAL #2   Title Patient will increase bilateral LE strength to 5/5    Time 3    Period Weeks    Status New    Target Date 04/25/21      PT SHORT TERM GOAL #3   Title Patient will be independent with base HEP    Time 3    Period Weeks    Status New    Target Date 04/25/21               PT Long Term Goals - 04/04/21 1349       PT LONG TERM GOAL #1   Title Patient will return to the gym without increased pain    Time 6    Period Weeks    Status New    Target Date 05/16/21      PT LONG TERM GOAL #2   Title Patient will sit in his car without pain in order to perfrom work tasks    Time 6     Period Weeks    Status New    Target Date 05/16/21      PT LONG TERM GOAL #3   Title Patient will report no pain or stiffness in the morning when he wakes up    Time 6    Period Weeks    Status New    Target Date 05/16/21                   Plan - 05/14/21 1631     Clinical Impression Statement Patient had spasming L>R in his lumbar spine today. He reports it has been that way since the last visit when he did squats. Therapy perfromed needling and maunual therapy on the patient. We reviewed basic exercises to perfrom over the next week. He was scheduled for more visits.    Personal Factors and Comorbidities Profession;Comorbidity 1    Comorbidities history of migraines    Examination-Activity Limitations Sit;Stand    Examination-Participation Restrictions Cleaning;Shop;Occupation    Stability/Clinical Decision Making Evolving/Moderate complexity    Clinical Decision Making Moderate    Rehab Potential Good    PT Frequency 2x / week    PT Duration 6 weeks    PT Treatment/Interventions ADLs/Self Care Home Management;Canalith Repostioning;Electrical Stimulation;Moist Heat;Ultrasound;Traction;Cryotherapy;DME Instruction;Balance training;Therapeutic exercise;Neuromuscular re-education;Patient/family education;Manual techniques;Passive range of motion;Spinal Manipulations    PT Next Visit Plan Manual therapy to lower lumbar spine and LAD if needed; review tolerance to exercises, review Gym exercises.    PT Home Exercise Plan Access Code: [email or web address]  Prepared by: Lorayne Bender    Exercises  Supine Piriformis Stretch with Foot on Ground - 1 x daily - 7 x weekly - 3 sets - 10 reps  Standing Glute Med Mobilization with Small Ball on Wall - 1 x daily - 7 x weekly - 3 sets - 3 reps - 1-2 min hold  Seated Hamstring Stretch - 1 x daily - 7 x weekly - 3 sets - 3 reps - 20 hold  Shoulder extension with resistance - Neutral - 1 x daily - 7 x weekly - 3 sets - 10 reps  Scapular Retraction with  Resistance - 1 x daily - 7 x weekly - 3 sets - 10 reps  Supine March - 1 x daily - 7 x weekly - 3 sets - 10 reps  Hooklying Clamshell with Resistance - 1 x daily - 7 x weekly - 3 sets - 10 reps    Consulted and Agree with Plan of Care Patient             Patient will benefit from skilled therapeutic intervention in order to improve the following deficits and impairments:  Increased muscle spasms, Decreased safety awareness, Decreased activity tolerance, Pain, Increased fascial restricitons  Visit Diagnosis: Chronic bilateral low back pain without sciatica  Muscle spasm of back     Problem List Patient Active Problem List   Diagnosis Date Noted   Hyperglycemia 02/24/2020   GERD (gastroesophageal reflux disease) 02/23/2020   Dyslipidemia 02/23/2020   Essential hypertension 02/23/2020   Allergic rhinitis 02/23/2020   Degenerative disc disease, lumbar 05/12/2018   Migraine     Dessie Coma PT DPT  05/15/2021, 12:58 PM  [facility] [address] [place], [place], [address] Phone: [phone]   Fax:  [phone]  Name: Michael Collier MRN: [id number] Date of Birth: 1981-05-12

## 2021-05-20 ENCOUNTER — Ambulatory Visit (HOSPITAL_BASED_OUTPATIENT_CLINIC_OR_DEPARTMENT_OTHER): Payer: 59 | Admitting: Physical Therapy

## 2021-05-29 ENCOUNTER — Ambulatory Visit (HOSPITAL_BASED_OUTPATIENT_CLINIC_OR_DEPARTMENT_OTHER): Payer: 59 | Admitting: Physical Therapy

## 2021-05-29 ENCOUNTER — Other Ambulatory Visit: Payer: Self-pay

## 2021-05-29 ENCOUNTER — Encounter (HOSPITAL_BASED_OUTPATIENT_CLINIC_OR_DEPARTMENT_OTHER): Payer: Self-pay | Admitting: Physical Therapy

## 2021-05-29 DIAGNOSIS — M545 Low back pain, unspecified: Secondary | ICD-10-CM | POA: Diagnosis not present

## 2021-05-29 DIAGNOSIS — G8929 Other chronic pain: Secondary | ICD-10-CM

## 2021-05-29 DIAGNOSIS — M62830 Muscle spasm of back: Secondary | ICD-10-CM

## 2021-05-30 ENCOUNTER — Encounter (HOSPITAL_BASED_OUTPATIENT_CLINIC_OR_DEPARTMENT_OTHER): Payer: Self-pay | Admitting: Physical Therapy

## 2021-05-30 NOTE — Addendum Note (Signed)
Addended by: Dessie Coma on: 05/30/2021 10:26 AM   Modules accepted: Orders

## 2021-05-30 NOTE — Therapy (Addendum)
[facility] [address] [place], [place], [address] Phone: [phone]   Fax:  [phone]  Physical Therapy Treatment  Patient Details  Name: Michael Collier MRN: [id number] Date of Birth: 1980-11-25 Referring Provider (PT): Dr Estill Bamberg   Encounter Date: 05/29/2021   PT End of Session - 05/29/21 1609     Visit Number 5    Number of Visits 6    Date for PT Re-Evaluation 07/03/21    Authorization Type UHC    PT Start Time 1600    PT Stop Time 1630    PT Time Calculation (min) 30 min    Activity Tolerance Patient tolerated treatment well    Behavior During Therapy [facility] for tasks assessed/performed             Past Medical History:  Diagnosis Date   Asthma    Migraine     Past Surgical History:  Procedure Laterality Date   None      There were no vitals filed for this visit.   Subjective Assessment - 05/29/21 1602     Subjective Patient reports that he is feeling well today and is able to workout with less pain. Patient stated that he tweak his back last week on Wed while working but it got better with rest and his HEP stretches. Patient stated soft tissue dry needling was effective from last visit.    Diagnostic tests MRI:L4-5: Diffuse disc bulge with disc desiccation and mild  intervertebral disc space narrowing. Superimposed central to right  subarticular disc protrusion with annular fissure mildly indents the  ventral thecal sac. Superimposed mild facet hypertrophy. Resultant  mild to moderate bilateral lateral recess narrowing, right worse  than left. Foramina remain patent. Central canal remains patent as  well.     L5-S1: Degenerative intervertebral disc space narrowing with diffuse  disc bulge and disc desiccation. Shallow central to right foraminal  disc protrusion indents the ventral thecal sac. Disc material  closely approximates both the exiting right L5 and descending S1  nerve roots, either which could  be affected. Superimposed mild facet  hypertrophy. No significant canal or lateral recess stenosis.  Moderate right L5 foraminal narrowing. Left neural foramen remains  patent.    Patient Stated Goals to have less pain    Currently in Pain? Yes    Pain Score 1     Pain Location Back    Pain Orientation Right;Left    Pain Descriptors / Indicators Aching    Pain Type Chronic pain    Pain Onset 1 to 4 weeks ago    Pain Relieving Factors rest and HEP    Effect of Pain on Daily Activities pain after long hours of work    Multiple Pain Sites No                [facility] PT Assessment - 05/30/21 0001       Assessment   Medical Diagnosis Low Back Pain    Referring Provider (PT) Dr Estill Bamberg      AROM   Lumbar Flexion full lumbar motion      Strength   Right Hip Flexion 5/5    Right Hip ABduction 5/5    Right Hip ADduction 5/5    Left Hip Flexion 5/5    Left Hip ABduction 5/5    Left Hip ADduction 5/5  [facility] Adult PT Treatment/Exercise - 05/30/21 0001       Manual Therapy   Manual Therapy Soft tissue mobilization;Joint mobilization    Manual therapy comments assessment of trigger points. IASYTM to glutes and lower back.    Joint Mobilization lower lumbar PA mobilizations    Soft tissue mobilization trigger point release to the left gluteal and lower back              Trigger Point Dry Needling - 05/30/21 0001     Consent Given? Yes    Education Handout Provided Yes    Muscles Treated Back/Hip Gluteus medius    Dry Needling Comments 3 spots needled in the righ gluteal good twtich 3 spots in the left good twtich 1 spot needled in the lower lumbar spine                  PT Education - 05/29/21 1609     Education Details Reviewed HEP and stretching exercises.    Person(s) Educated Patient    Methods Explanation;Demonstration    Comprehension Verbalized understanding;Returned demonstration              PT  Short Term Goals - 04/04/21 1348       PT SHORT TERM GOAL #1   Title Patient will demonstrate full lumbar spine mobility without pain    Time 3    Period Weeks    Status New    Target Date 04/25/21      PT SHORT TERM GOAL #2   Title Patient will increase bilateral LE strength to 5/5    Time 3    Period Weeks    Status New    Target Date 04/25/21      PT SHORT TERM GOAL #3   Title Patient will be independent with base HEP    Time 3    Period Weeks    Status New    Target Date 04/25/21               PT Long Term Goals - 04/04/21 1349       PT LONG TERM GOAL #1   Title Patient will return to the gym without increased pain    Time 6    Period Weeks    Status New    Target Date 05/16/21      PT LONG TERM GOAL #2   Title Patient will sit in his car without pain in order to perfrom work tasks    Time 6    Period Weeks    Status New    Target Date 05/16/21      PT LONG TERM GOAL #3   Title Patient will report no pain or stiffness in the morning when he wakes up    Time 6    Period Weeks    Status New    Target Date 05/16/21                   Plan - 05/29/21 1611     Clinical Impression Statement Patient has made progress. Follwing his session last time he had no pain and went back to working. He had an incdent climbing into a window and now has had minor dsoreness int he morning. Therapy has needled him again. He has full strength and lumbar ROM compared to his initial eval. Wewill keep him open for another 4-5 weeks to see how he resposnds to his session today and how he does continuing with  his exercises on his own. he had a limited session today. He was going to do his exercises on his own and manual was completed in 30 minutes. If we dont hear from him in 4-5 weeks we will discharge the episode.    Personal Factors and Comorbidities Profession;Comorbidity 1    Comorbidities history of migraines    Examination-Activity Limitations Sit;Stand     Examination-Participation Restrictions Cleaning;Shop;Occupation    Stability/Clinical Decision Making Evolving/Moderate complexity    Clinical Decision Making Moderate    Rehab Potential Good    PT Frequency 2x / week    PT Duration 6 weeks    PT Treatment/Interventions ADLs/Self Care Home Management;Canalith Repostioning;Electrical Stimulation;Moist Heat;Ultrasound;Traction;Cryotherapy;DME Instruction;Balance training;Therapeutic exercise;Neuromuscular re-education;Patient/family education;Manual techniques;Passive range of motion;Spinal Manipulations    PT Next Visit Plan Manual therapy to lower lumbar spine and LAD if needed; review tolerance to exercises, review Gym exercises.    PT Home Exercise Plan Access Code: [email or web address]  Prepared by: Lorayne Bender    Exercises  Supine Piriformis Stretch with Foot on Ground - 1 x daily - 7 x weekly - 3 sets - 10 reps  Standing Glute Med Mobilization with Small Ball on Wall - 1 x daily - 7 x weekly - 3 sets - 3 reps - 1-2 min hold  Seated Hamstring Stretch - 1 x daily - 7 x weekly - 3 sets - 3 reps - 20 hold  Shoulder extension with resistance - Neutral - 1 x daily - 7 x weekly - 3 sets - 10 reps  Scapular Retraction with Resistance - 1 x daily - 7 x weekly - 3 sets - 10 reps  Supine March - 1 x daily - 7 x weekly - 3 sets - 10 reps  Hooklying Clamshell with Resistance - 1 x daily - 7 x weekly - 3 sets - 10 reps    Consulted and Agree with Plan of Care Patient             Patient will benefit from skilled therapeutic intervention in order to improve the following deficits and impairments:  Increased muscle spasms, Decreased safety awareness, Decreased activity tolerance, Pain, Increased fascial restricitons  Visit Diagnosis: Chronic bilateral low back pain without sciatica  Muscle spasm of back     Problem List Patient Active Problem List   Diagnosis Date Noted   Hyperglycemia 02/24/2020   GERD (gastroesophageal reflux disease) 02/23/2020    Dyslipidemia 02/23/2020   Essential hypertension 02/23/2020   Allergic rhinitis 02/23/2020   Degenerative disc disease, lumbar 05/12/2018   Migraine     Dessie Coma PT DPT  05/30/2021, 10:20 AM  Collins Scotland SPT  05/30/2021  [facility] [address] [place], [place], [address] Phone: [phone]   Fax:  [phone]  Name: Tiant Peixoto MRN: [id number] Date of Birth: 1981-04-05

## 2021-06-27 ENCOUNTER — Ambulatory Visit (HOSPITAL_BASED_OUTPATIENT_CLINIC_OR_DEPARTMENT_OTHER): Payer: 59 | Attending: Orthopedic Surgery | Admitting: Physical Therapy

## 2021-06-27 ENCOUNTER — Encounter (HOSPITAL_BASED_OUTPATIENT_CLINIC_OR_DEPARTMENT_OTHER): Payer: Self-pay | Admitting: Physical Therapy

## 2021-06-27 ENCOUNTER — Other Ambulatory Visit: Payer: Self-pay

## 2021-06-27 DIAGNOSIS — M545 Low back pain, unspecified: Secondary | ICD-10-CM | POA: Insufficient documentation

## 2021-06-27 DIAGNOSIS — M62830 Muscle spasm of back: Secondary | ICD-10-CM | POA: Diagnosis present

## 2021-06-27 DIAGNOSIS — G8929 Other chronic pain: Secondary | ICD-10-CM | POA: Diagnosis present

## 2021-06-27 NOTE — Therapy (Signed)
[facility] [address] [place], [place], [address] Phone: [phone]   Fax:  [phone]  Physical Therapy Treatment  Patient Details  Name: Michael Collier MRN: [id number] Date of Birth: 1981/08/11 Referring Provider (PT): Dr Estill Bamberg   Encounter Date: 06/27/2021   PT End of Session - 06/27/21 1254     Visit Number 6    Number of Visits 7    Date for PT Re-Evaluation 07/11/21    Authorization Type [employer]    PT Start Time 1234    PT Stop Time 1254    PT Time Calculation (min) 20 min    Activity Tolerance Patient tolerated treatment well    Behavior During Therapy [facility] for tasks assessed/performed             Past Medical History:  Diagnosis Date   Asthma    Migraine     Past Surgical History:  Procedure Laterality Date   None      There were no vitals filed for this visit.   Subjective Assessment - 06/27/21 1234     Subjective When I get up in the AM, I feel it the most. Slept on a different bed while on vacation.    Patient Stated Goals to have less pain    Currently in Pain? Yes    Pain Score 3     Pain Location Back    Pain Orientation Right;Left    Pain Descriptors / Indicators Sore                [facility] PT Assessment - 06/27/21 0001       Assessment   Medical Diagnosis Low Back Pain    Referring Provider (PT) Dr Estill Bamberg      AROM   Lumbar Flexion full lumbar motion      Palpation   Palpation comment TTP Lt gluts                           [facility] Adult PT Treatment/Exercise - 06/27/21 0001       Lumbar Exercises: Stretches   Passive Hamstring Stretch Limitations supine with strap- midline & lateral    Piriformis Stretch Limitations ankle over knee & with opp leg extended              Trigger Point Dry Needling - 06/27/21 0001     Muscles Treated Back/Hip Gluteus medius;Gluteus minimus    Gluteus Minimus Response Twitch response elicited;Palpable  increased muscle length   Left   Gluteus Medius Response Twitch response elicited;Palpable increased muscle length   Left                   PT Short Term Goals - 06/27/21 1305       PT SHORT TERM GOAL #1   Title Patient will demonstrate full lumbar spine mobility without pain    Status Achieved      PT SHORT TERM GOAL #2   Title Patient will increase bilateral LE strength to 5/5    Baseline tested in previous appt    Status Achieved      PT SHORT TERM GOAL #3   Title Patient will be independent with base HEP    Status Achieved               PT Long Term Goals - 06/27/21 1305       PT LONG TERM GOAL #1   Title  Patient will return to the gym without increased pain    Baseline had some discomfort today with increased spasm.    Status On-going      PT LONG TERM GOAL #2   Title Patient will sit in his car without pain in order to perfrom work tasks    Status Achieved      PT LONG TERM GOAL #3   Title Patient will report no pain or stiffness in the morning when he wakes up    Baseline still feeling this-especially when he is in a different bed    Status On-going      PT LONG TERM GOAL #4   Title Pt will reduce overall pain to no more than 2/10 for ADL's and work activities. (6 weeks 07/01/18)    Status Achieved                   Plan - 06/27/21 1301     Clinical Impression Statement Significant twitch response to DN today in glut med and min with reduction in concordant symptoms. pt just returned from vacation and is returning to gym program and ADLs- for this reason, I will extend his POC to 9/1 in order to monitor his response to treatement as he returns to his daily acitivites. Will d/c if he is doing well at that point.    PT Duration 2 weeks    PT Treatment/Interventions ADLs/Self Care Home Management;Canalith Repostioning;Electrical Stimulation;Moist Heat;Ultrasound;Traction;Cryotherapy;DME Instruction;Balance training;Therapeutic  exercise;Neuromuscular re-education;Patient/family education;Manual techniques;Passive range of motion;Spinal Manipulations    PT Next Visit Plan DN PRN, gym okay?    PT Home Exercise Plan Access Code: [email or web address]  Prepared by: Lorayne Bender    Exercises  Supine Piriformis Stretch with Foot on Ground - 1 x daily - 7 x weekly - 3 sets - 10 reps  Standing Glute Med Mobilization with Small Ball on Wall - 1 x daily - 7 x weekly - 3 sets - 3 reps - 1-2 min hold  Seated Hamstring Stretch - 1 x daily - 7 x weekly - 3 sets - 3 reps - 20 hold  Shoulder extension with resistance - Neutral - 1 x daily - 7 x weekly - 3 sets - 10 reps  Scapular Retraction with Resistance - 1 x daily - 7 x weekly - 3 sets - 10 reps  Supine March - 1 x daily - 7 x weekly - 3 sets - 10 reps  Hooklying Clamshell with Resistance - 1 x daily - 7 x weekly - 3 sets - 10 reps    Consulted and Agree with Plan of Care Patient             Patient will benefit from skilled therapeutic intervention in order to improve the following deficits and impairments:  Increased muscle spasms, Decreased safety awareness, Decreased activity tolerance, Pain, Increased fascial restricitons  Visit Diagnosis: Chronic bilateral low back pain without sciatica - Plan: PT plan of care cert/re-cert  Muscle spasm of back - Plan: PT plan of care cert/re-cert     Problem List Patient Active Problem List   Diagnosis Date Noted   Hyperglycemia 02/24/2020   GERD (gastroesophageal reflux disease) 02/23/2020   Dyslipidemia 02/23/2020   Essential hypertension 02/23/2020   Allergic rhinitis 02/23/2020   Degenerative disc disease, lumbar 05/12/2018   Migraine    Jelesa Mangini C. Marline Morace PT, DPT 06/27/21 1:09 PM   [facility] [address] [place], [place], [address] Phone: [phone]  Fax:  [phone]  Name: Hasson Gaspard MRN: [id number] Date of Birth: 07-Jul-1981

## 2021-08-23 ENCOUNTER — Other Ambulatory Visit: Payer: Self-pay

## 2021-08-23 ENCOUNTER — Encounter: Payer: Self-pay | Admitting: Family Medicine

## 2021-08-23 ENCOUNTER — Ambulatory Visit (INDEPENDENT_AMBULATORY_CARE_PROVIDER_SITE_OTHER): Payer: 59 | Admitting: Family Medicine

## 2021-08-23 VITALS — BP 123/82 | HR 83 | Temp 98.3°F | Ht 70.0 in | Wt 234.3 lb

## 2021-08-23 DIAGNOSIS — I10 Essential (primary) hypertension: Secondary | ICD-10-CM

## 2021-08-23 DIAGNOSIS — Z6833 Body mass index (BMI) 33.0-33.9, adult: Secondary | ICD-10-CM

## 2021-08-23 DIAGNOSIS — R739 Hyperglycemia, unspecified: Secondary | ICD-10-CM | POA: Diagnosis not present

## 2021-08-23 DIAGNOSIS — Z0001 Encounter for general adult medical examination with abnormal findings: Secondary | ICD-10-CM | POA: Diagnosis not present

## 2021-08-23 DIAGNOSIS — E785 Hyperlipidemia, unspecified: Secondary | ICD-10-CM | POA: Diagnosis not present

## 2021-08-23 DIAGNOSIS — E669 Obesity, unspecified: Secondary | ICD-10-CM

## 2021-08-23 LAB — COMPREHENSIVE METABOLIC PANEL
ALT: 24 U/L (ref 0–53)
AST: 21 U/L (ref 0–37)
Albumin: 4.8 g/dL (ref 3.5–5.2)
Alkaline Phosphatase: 74 U/L (ref 39–117)
BUN: 24 mg/dL — ABNORMAL HIGH (ref 6–23)
CO2: 27 mEq/L (ref 19–32)
Calcium: 9.6 mg/dL (ref 8.4–10.5)
Chloride: 101 mEq/L (ref 96–112)
Creatinine, Ser: 1.15 mg/dL (ref 0.40–1.50)
GFR: 78.67 mL/min (ref >60.00)
Glucose, Bld: 76 mg/dL (ref 70–99)
Potassium: 4 mEq/L (ref 3.5–5.1)
Sodium: 137 mEq/L (ref 135–145)
Total Bilirubin: 0.7 mg/dL (ref 0.2–1.2)
Total Protein: 7.1 g/dL (ref 6.0–8.3)

## 2021-08-23 LAB — LIPID PANEL
Cholesterol: 167 mg/dL (ref 0–200)
HDL: 34.7 mg/dL — ABNORMAL LOW (ref >39.00)
LDL Cholesterol: 102 mg/dL — ABNORMAL HIGH (ref 0–99)
NonHDL: 132.36
Total CHOL/HDL Ratio: 5
Triglycerides: 152 mg/dL — ABNORMAL HIGH (ref 0.0–149.0)
VLDL: 30.4 mg/dL (ref 0.0–40.0)

## 2021-08-23 LAB — CBC
HCT: 44.1 % (ref 39.0–52.0)
Hemoglobin: 15.1 g/dL (ref 13.0–17.0)
MCHC: 34.2 g/dL (ref 30.0–36.0)
MCV: 87.6 fl (ref 78.0–100.0)
Platelets: 172 10*3/uL (ref 150.0–400.0)
RBC: 5.03 Mil/uL (ref 4.22–5.81)
RDW: 13.6 % (ref 11.5–15.5)
WBC: 8.8 10*3/uL (ref 4.0–10.5)

## 2021-08-23 LAB — TSH: TSH: 2.42 u[IU]/mL (ref 0.35–5.50)

## 2021-08-23 LAB — HEMOGLOBIN A1C: Hgb A1c MFr Bld: 4.9 % (ref 4.6–6.5)

## 2021-08-23 NOTE — Assessment & Plan Note (Signed)
At goal off meds.  Check labs. 

## 2021-08-23 NOTE — Assessment & Plan Note (Signed)
Check labs.  Continue lifestyle modifications. °

## 2021-08-23 NOTE — Patient Instructions (Signed)
It was very nice to see you today!  We we will check blood work today.  Please keep an eye on your blood pressure and let us know if it is persistently elevated.  I will see you back in year for your next physical.  Come back to see me sooner if needed.  Take care, Dr [name]  PLEASE NOTE:  If you had any lab tests please let us know if you have not heard back within a few days. You may see your results on mychart before we have a chance to review them but we will give you a call once they are reviewed by [place]. If we ordered any referrals today, please let us know if you have not heard from their office within the next week.   Please try these tips to maintain a healthy lifestyle:  Eat at least 3 REAL meals and 1-2 snacks per day.  Aim for no more than 5 hours between eating.  If you eat breakfast, please do so within one hour of getting up.   Each meal should contain half fruits/vegetables, one quarter protein, and one quarter carbs (no bigger than a computer mouse)  Cut down on sweet beverages. This includes juice, soda, and sweet tea.   Drink at least 1 glass of water with each meal and aim for at least 8 glasses per day  Exercise at least 150 minutes every week.

## 2021-08-23 NOTE — Assessment & Plan Note (Signed)
Check A1c. 

## 2021-08-23 NOTE — Progress Notes (Signed)
Chief Complaint:  Michael Collier is a 40 y.o. male who presents today for his annual comprehensive physical exam.    Assessment/Plan:  Chronic Problems Addressed Today: Hyperglycemia Check A1c.   Essential hypertension At goal off meds. Check labs.   Dyslipidemia Check labs.  Continue lifestyle modifications.  Body mass index is 33.62 kg/m. / Obese  BMI Metric Follow Up - 08/23/21 1341       BMI Metric Follow Up-Please document annually   BMI Metric Follow Up Education provided              Preventative Healthcare: Check Labs.  Patient Counseling(The following topics were reviewed and/or handout was given):  -Nutrition: Stressed importance of moderation in sodium/caffeine intake, saturated fat and cholesterol, caloric balance, sufficient intake of fresh fruits, vegetables, and fiber.  -Stressed the importance of regular exercise.   -Substance Abuse: Discussed cessation/primary prevention of tobacco, alcohol, or other drug use; driving or other dangerous activities under the influence; availability of treatment for abuse.   -Injury prevention: Discussed safety belts, safety helmets, smoke detector, smoking near bedding or upholstery.   -Sexuality: Discussed sexually transmitted diseases, partner selection, use of condoms, avoidance of unintended pregnancy and contraceptive alternatives.   -Dental health: Discussed importance of regular tooth brushing, flossing, and dental visits.  -Health maintenance and immunizations reviewed. Please refer to Health maintenance section.  Return to care in 1 year for next preventative visit.     Subjective:  HPI:  He has no acute complaints today.   He states that he has concerns about his cholesterol and blood sugar, as his last visit measured blood sugar at a borderline level, and his cholesterol was above average.  He has a way to measure blood pressure at home, but has not been performing readings recently.  BP Readings from  Last 3 Encounters:  08/23/21 123/82  03/07/21 (!) 156/101  02/23/20 (!) 146/90   The back pain he has been dealing with is currently manageable with recommended exercises.  Lifestyle Diet: Reasonably healthy diet  Exercise: Works out 3-5 days a week: weightlifting and cardio.  Depression screen PHQ 2/9 08/23/2021  Decreased Interest 0  Down, Depressed, Hopeless 0  PHQ - 2 Score 0    Health Maintenance Due  Topic Date Due   HIV Screening  Never done   Hepatitis C Screening  Never done   TETANUS/TDAP  Never done     ROS: Per HPI, otherwise a complete review of systems was negative.   PMH:  The following were reviewed and entered/updated in epic: Past Medical History:  Diagnosis Date   Asthma    Migraine    Patient Active Problem List   Diagnosis Date Noted   Hyperglycemia 02/24/2020   GERD (gastroesophageal reflux disease) 02/23/2020   Dyslipidemia 02/23/2020   Essential hypertension 02/23/2020   Allergic rhinitis 02/23/2020   Degenerative disc disease, lumbar 05/12/2018   Migraine    Past Surgical History:  Procedure Laterality Date   None      Family History  Problem Relation Age of Onset   Hypertension Mother    Asthma Mother    Hypertension Father    Hyperlipidemia Father    Arthritis Father    Cancer Other     Medications- reviewed and updated Current Outpatient Medications  Medication Sig Dispense Refill   cetirizine (ZYRTEC) 10 MG tablet Take 10 mg by mouth daily as needed for allergies.      IBUPROFEN PO Take by mouth as needed.  Omeprazole 20 MG TBDD Take by mouth.     traMADol (ULTRAM) 50 MG tablet Take 1-2 tablets (50-100 mg total) by mouth every 8 (eight) hours as needed for moderate pain. Maximum 6 tabs per day. 60 tablet 0   No current facility-administered medications for this visit.    Allergies-reviewed and updated No Known Allergies  Social History   Socioeconomic History   Marital status: Married    Spouse name: [name]    Number of children: 2   Years of education: college   Highest education level: Not on file  Occupational History    Employer: [employer]  Tobacco Use   Smoking status: Former   Smokeless tobacco: Former    Types: Snuff    Quit date: 10/10/2013   Tobacco comments:    Quit 2014  Substance and Sexual Activity   Alcohol use: Yes    Alcohol/week: 1.0 standard drink    Types: 1 Standard drinks or equivalent per week    Comment: Social    Drug use: No   Sexual activity: Not on file  Other Topics Concern   Not on file  Social History Narrative   Patient lives at home with his wife and children [name])   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   Education some college.   Left handed.   Caffeine tea three or four when he is at work. Un sweet tea.   Social Determinants of Health   Financial Resource Strain: Not on file  Food Insecurity: Not on file  Transportation Needs: Not on file  Physical Activity: Not on file  Stress: Not on file  Social Connections: Not on file        Objective:  Physical Exam: BP 123/82   Pulse 83   Temp 98.3 F (36.8 C) (Temporal)   Ht 5\' 10"  (1.778 m)   Wt 234 lb 4.8 oz (106.3 kg)   SpO2 100%   BMI 33.62 kg/m   Body mass index is 33.62 kg/m. Wt Readings from Last 3 Encounters:  08/23/21 234 lb 4.8 oz (106.3 kg)  02/23/20 238 lb (108 kg)  05/12/18 231 lb (104.8 kg)   Gen: NAD, resting comfortably HEENT: TMs normal bilaterally. OP clear. No thyromegaly noted.  CV: RRR with no murmurs appreciated Pulm: NWOB, CTAB with no crackles, wheezes, or rhonchi GI: Normal bowel sounds present. Soft, Nontender, Nondistended. MSK: no edema, cyanosis, or clubbing noted Skin: warm, dry Neuro: CN2-12 grossly intact. Strength 5/5 in upper and lower extremities. Reflexes symmetric and intact bilaterally.  Psych: Normal affect and thought content     I,Jordan Kelly,acting as a scribe for 07/13/18, MD.,have  documented all relevant documentation on the behalf of Jacquiline Doe, MD,as directed by  Jacquiline Doe, MD while in the presence of Jacquiline Doe, MD.  I, Jacquiline Doe, MD, have reviewed all documentation for this visit. The documentation on 08/23/21 for the exam, diagnosis, procedures, and orders are all accurate and complete.  08/25/21. Katina Degree, MD 08/23/2021 1:41 PM

## 2021-08-26 NOTE — Progress Notes (Signed)
Please inform patient of the following:  His cholesterol is borderline elevated but better than last year. Everything else is NORMAL. Do not need to make any changes to his treatment plan at this time we can recheck next year.  Katina Degree. [name], MD 08/26/2021 8:00 AM

## 2021-12-14 ENCOUNTER — Encounter: Payer: Self-pay | Admitting: Sports Medicine

## 2021-12-14 DIAGNOSIS — M5136 Other intervertebral disc degeneration, lumbar region: Secondary | ICD-10-CM

## 2021-12-14 DIAGNOSIS — M51369 Other intervertebral disc degeneration, lumbar region without mention of lumbar back pain or lower extremity pain: Secondary | ICD-10-CM

## 2021-12-18 ENCOUNTER — Other Ambulatory Visit: Payer: Self-pay

## 2021-12-18 ENCOUNTER — Ambulatory Visit (HOSPITAL_BASED_OUTPATIENT_CLINIC_OR_DEPARTMENT_OTHER): Payer: 59 | Attending: Sports Medicine | Admitting: Physical Therapy

## 2021-12-18 ENCOUNTER — Encounter (HOSPITAL_BASED_OUTPATIENT_CLINIC_OR_DEPARTMENT_OTHER): Payer: Self-pay | Admitting: Physical Therapy

## 2021-12-18 DIAGNOSIS — M545 Low back pain, unspecified: Secondary | ICD-10-CM | POA: Insufficient documentation

## 2021-12-18 DIAGNOSIS — M62830 Muscle spasm of back: Secondary | ICD-10-CM | POA: Insufficient documentation

## 2021-12-18 DIAGNOSIS — G8929 Other chronic pain: Secondary | ICD-10-CM | POA: Diagnosis present

## 2021-12-18 NOTE — Therapy (Signed)
OUTPATIENT PHYSICAL THERAPY THORACOLUMBAR EVALUATION   Patient Name: Michael Collier MRN: [id number] DOB:[birth year], 41 y.o., male Today's Date: 12/19/2021   PT End of Session - 12/19/21 0621     Visit Number 1    Number of Visits 6    Date for PT Re-Evaluation 01/30/22    PT Start Time 1100    PT Stop Time 1142    PT Time Calculation (min) 42 min    Activity Tolerance Patient tolerated treatment well    Behavior During Therapy [facility] for tasks assessed/performed             Past Medical History:  Diagnosis Date   Asthma    Migraine    Past Surgical History:  Procedure Laterality Date   None     Patient Active Problem List   Diagnosis Date Noted   Hyperglycemia 02/24/2020   GERD (gastroesophageal reflux disease) 02/23/2020   Dyslipidemia 02/23/2020   Essential hypertension 02/23/2020   Allergic rhinitis 02/23/2020   Degenerative disc disease, lumbar 05/12/2018   Migraine     PCP: Vivi Barrack, MD  REFERRING PROVIDER: Silverio Decamp,*  REFERRING DIAG: Low Back Pain   THERAPY DIAG:  Chronic bilateral low back pain without sciatica  Muscle spasm of back  ONSET DATE:   SUBJECTIVE:                                                                                                                                                                                           SUBJECTIVE STATEMENT: Patient began having tightness in his lower back about a week ago. He was in a different car at work. He feels like that may have contributed.  PERTINENT HISTORY:  Migraines,   PAIN:  Are you having pain? Yes NPRS scale: 3/10  Pain location: Left sided lower back  Pain orientation: Left  PAIN TYPE: aching Pain description: intermittent  Aggravating factors: Standing and walking  Relieving factors: rest   PRECAUTIONS: None  WEIGHT BEARING RESTRICTIONS No  FALLS:  Has patient fallen in last 6 months? No, Number of falls:   LIVING ENVIRONMENT: Nothing  pertinent  OCCUPATION: Engineer, structural   Recreation: Goes to [employer]. Patient has lost 40 lbs over the past year   PLOF: Independent  PATIENT GOALS :  To have less pain    OBJECTIVE:   DIAGNOSTIC FINDINGS:  Nothing at this time   PATIENT SURVEYS:  FOTO    SCREENING FOR RED FLAGS: Bowel or bladder incontinence: No Spinal tumors: No Cauda equina syndrome: No Compression fracture: No Abdominal aneurysm: No  COGNITION:  Overall cognitive status: Within functional limits for  tasks assessed     SENSATION:  Light touch: Appears intact  Stereognosis: Appears intact  Hot/Cold: Appears intact  Proprioception: Appears intact  No radicular pain at this time    POSTURE:  Good   PALPATION:   LUMBARAROM/PROM  A/PROM A/PROM  12/19/2021  Flexion Full without pain   Extension Full without pain   Right lateral flexion No pain   Left lateral flexion Mild pain at end range   Right rotation No pain   Left rotation No pain    (Blank rows = not tested)  LE AROM/PROM:  A/PROM Right 12/19/2021 Left 12/19/2021  Hip flexion    Hip extension    Hip abduction    Hip adduction    Hip internal rotation    Hip external rotation    Knee flexion    Knee extension    Ankle dorsiflexion    Ankle plantarflexion    Ankle inversion    Ankle eversion     (Blank rows = not tested)  LE MMT:  MMT Right 12/19/2021 Left 12/19/2021  Hip flexion 5/5 5/5  Hip extension    Hip abduction 5/5 5/5  Hip adduction    Hip internal rotation    Hip external rotation    Knee flexion    Knee extension 5/5 5/5  Ankle dorsiflexion    Ankle plantarflexion    Ankle inversion    Ankle eversion     (Blank rows = not tested)     GAIT: Normal    TODAY'S TREATMENT  Trigger Point Dry-Needling  Treatment instructions: Expect mild to moderate muscle soreness. S/S of pneumothorax if dry needled over a lung field, and to seek immediate medical attention should they occur. Patient verbalized  understanding of these instructions and education.  Patient Consent Given: Yes Education handout provided: Previously provided Muscles treated: left glut medius using a .30x50 Electrical stimulation performed: No Parameters: N/A Treatment response/outcome: Twitch response elicited and Palpable decrease in muscle tension    PATIENT EDUCATION:  Education details: benefits and risk of TPDN, symptom management POC  Person educated: Patient Education method: Explanation, Demonstration, Tactile cues, and Verbal cues Education comprehension: verbalized understanding, returned demonstration, verbal cues required, tactile cues required, and needs further education   HOME EXERCISE PROGRAM: Access Code: [email or web address] URL: [email or web address] Date: 12/19/2021 Prepared by: Lorayne Bender  Exercises Supine Piriformis Stretch with Foot on Ground - 1 x daily - 7 x weekly - 3 sets - 10 reps Supine Bridge - 1 x daily - 7 x weekly - 3 sets - 10 reps Hooklying Clamshell with Resistance - 1 x daily - 7 x weekly - 3 sets - 10 reps Standing Glute Med Mobilization with Small Ball on Wall - 1 x daily - 7 x weekly - 3 sets - 10 reps   ASSESSMENT:  CLINICAL IMPRESSION: Patient is a 41 y.o. male who was seen today for physical therapy evaluation and treatment for acute onswt of left sided low back pain. He has minor pain with left side bending. All other motions are full He has full hip motion and strength. At this point it appears to be a trigger point in his upper gluteal. He has mild tightness in his lower lumbar spine. He would benefit from skilled therapy to improve ability to sit in his car at work and return to the gym.    Objective impairments include decreased activity tolerance, increased fascial restrictions, and pain. These impairments are limiting patient from occupation. Personal factor are not  affecting patient's functional outcome. Patient will benefit from skilled PT to address  above impairments and improve overall function.  REHAB POTENTIAL: Excellent  CLINICAL DECISION MAKING: Stable/uncomplicated  EVALUATION COMPLEXITY: Low   GOALS: Goals reviewed with patient? Yes  SHORT TERM GOALS:  STG=LTG  LONG TERM GOALS:   LTG Name Target Date Goal status  1 Patient will sit in his car at wor without pain  Baseline: 01/30/2022 INITIAL  2 Patient will report no tenderness to palpation in the gluteal in order to perform daily activity without pain  Baseline: 01/30/2022 INITIAL  3 Patient will be independent with home exercise program to prevent further flair-ups Baseline: 01/30/2022 INITIAL  4 Patient will return to the gym without back pain  Baseline: 01/30/2022 INITIAL  PLAN: PT FREQUENCY: 1x/week  PT DURATION: 6 weeks  PLANNED INTERVENTIONS: Therapeutic exercises, Therapeutic activity, Neuro Muscular re-education, Patient/Family education, Joint mobilization, Aquatic Therapy, Dry Needling, Spinal mobilization, Cryotherapy, Moist heat, Taping, and Manual therapy  PLAN FOR NEXT SESSION: continue with manual therapy to gluteal; continue with trigger point dry needling. Continue with moderate core strengthening. Review gym activity that the patient is doing.    Carney Living, PT DPT  12/19/2021, 6:25 AM

## 2021-12-19 ENCOUNTER — Encounter (HOSPITAL_BASED_OUTPATIENT_CLINIC_OR_DEPARTMENT_OTHER): Payer: Self-pay | Admitting: Physical Therapy

## 2021-12-23 ENCOUNTER — Other Ambulatory Visit: Payer: Self-pay

## 2021-12-23 ENCOUNTER — Ambulatory Visit (HOSPITAL_BASED_OUTPATIENT_CLINIC_OR_DEPARTMENT_OTHER): Payer: 59 | Admitting: Physical Therapy

## 2021-12-23 DIAGNOSIS — M545 Low back pain, unspecified: Secondary | ICD-10-CM

## 2021-12-23 DIAGNOSIS — M62830 Muscle spasm of back: Secondary | ICD-10-CM

## 2021-12-23 DIAGNOSIS — G8929 Other chronic pain: Secondary | ICD-10-CM

## 2021-12-24 ENCOUNTER — Encounter (HOSPITAL_BASED_OUTPATIENT_CLINIC_OR_DEPARTMENT_OTHER): Payer: Self-pay | Admitting: Physical Therapy

## 2021-12-24 NOTE — Therapy (Signed)
OUTPATIENT PHYSICAL THERAPY THORACOLUMBAR  Treatment    Patient Name: Michael Collier MRN: [id number] DOB:[birth year], 41 y.o., male Today's Date: 12/24/2021   PT End of Session - 12/24/21 1143     Visit Number 2    Number of Visits 6    Date for PT Re-Evaluation 01/30/22    Authorization Type [employer]    PT Start Time 0358    PT Stop Time 0438    PT Time Calculation (min) 40 min    Activity Tolerance Patient tolerated treatment well    Behavior During Therapy [facility] for tasks assessed/performed              Past Medical History:  Diagnosis Date   Asthma    Migraine    Past Surgical History:  Procedure Laterality Date   None     Patient Active Problem List   Diagnosis Date Noted   Hyperglycemia 02/24/2020   GERD (gastroesophageal reflux disease) 02/23/2020   Dyslipidemia 02/23/2020   Essential hypertension 02/23/2020   Allergic rhinitis 02/23/2020   Degenerative disc disease, lumbar 05/12/2018   Migraine     PCP: Vivi Barrack, MD  REFERRING PROVIDER: Vivi Barrack, MD  REFERRING DIAG: Low Back Pain   THERAPY DIAG:  Chronic bilateral low back pain without sciatica  Muscle spasm of back  ONSET DATE:   SUBJECTIVE:                                                                                                                                                                                           SUBJECTIVE STATEMENT: Patient reports he was sore the next day, but felt like the tightness decreased over the next few days. He has been going to the gym. No pain today.   PERTINENT HISTORY:  Migraines,   PAIN:  Are you having pain? No NPRS scale: 0/10  Pain location: Left sided lower back  Pain orientation: Left  PAIN TYPE: aching Pain description: intermittent  Aggravating factors: Standing and walking  Relieving factors: rest   PRECAUTIONS: None  WEIGHT BEARING RESTRICTIONS No  FALLS:  Has patient fallen in last 6 months? No, Number of falls:    LIVING ENVIRONMENT: Nothing pertinent  OCCUPATION: Engineer, structural   Recreation: Goes to [employer]. Patient has lost 40 lbs over the past year   PLOF: Independent  PATIENT GOALS :  To have less pain    OBJECTIVE:     TODAY'S TREATMENT  2/14 Treatment instructions: Expect mild to moderate muscle soreness. S/S of pneumothorax if dry needled over a lung field, and to seek immediate medical attention should they occur. Patient verbalized  understanding of these instructions and education.  Patient Consent Given: Yes Education handout provided: Previously provided Muscles treated: left glut medius using a .30x50 2 spots  Electrical stimulation performed: No Parameters: N/A Treatment response/outcome: Twitch response elicited and Palpable decrease in muscle tension   Manual: Trigger point release to the gluteal   Exercises:  Supine: bridge 3x10  Clamshell x30 blue   Cable machine:  Chop 2x10 15lbs  Pallof press 210 15 lbs    Eval  Trigger Point Dry-Needling  Treatment instructions: Expect mild to moderate muscle soreness. S/S of pneumothorax if dry needled over a lung field, and to seek immediate medical attention should they occur. Patient verbalized understanding of these instructions and education.  Patient Consent Given: Yes Education handout provided: Previously provided Muscles treated: left glut medius using a .30x50 2 spots  Electrical stimulation performed: No Parameters: N/A Treatment response/outcome: Twitch response elicited and Palpable decrease in muscle tension    PATIENT EDUCATION:  Education details: benefits and risk of TPDN, symptom management POC  Person educated: Patient Education method: Explanation, Demonstration, Tactile cues, and Verbal cues Education comprehension: verbalized understanding, returned demonstration, verbal cues required, tactile cues required, and needs further education   HOME EXERCISE PROGRAM: Access Code: [email or web address] URL:  [email or web address] Date: 12/19/2021 Prepared by: Carolyne Littles  Exercises Supine Piriformis Stretch with Foot on Ground - 1 x daily - 7 x weekly - 3 sets - 10 reps Supine Bridge - 1 x daily - 7 x weekly - 3 sets - 10 reps Hooklying Clamshell with Resistance - 1 x daily - 7 x weekly - 3 sets - 10 reps Standing Glute Med Mobilization with Small Ball on Wall - 1 x daily - 7 x weekly - 3 sets - 10 reps   ASSESSMENT:  CLINICAL IMPRESSION: The patient is making progress. His trigger point has reduced significantly. He is still tender to palpation in his gluteal but the spot was much harder to find. Therapy reviewed some base exercises which he tolerated well. We will continue to advance as tolerated.   Objective impairments include decreased activity tolerance, increased fascial restrictions, and pain. These impairments are limiting patient from occupation. Personal factor are not affecting patient's functional outcome. Patient will benefit from skilled PT to address above impairments and improve overall function.  REHAB POTENTIAL: Excellent  CLINICAL DECISION MAKING: Stable/uncomplicated  EVALUATION COMPLEXITY: Low   GOALS: Goals reviewed with patient? Yes  SHORT TERM GOALS:  STG=LTG  LONG TERM GOALS:   LTG Name Target Date Goal status  1 Patient will sit in his car at wor without pain  Baseline: 02/04/2022 INITIAL  2 Patient will report no tenderness to palpation in the gluteal in order to perform daily activity without pain  Baseline: 02/04/2022 INITIAL  3 Patient will be independent with home exercise program to prevent further flair-ups Baseline: 02/04/2022 INITIAL  4 Patient will return to the gym without back pain  Baseline: 02/04/2022 INITIAL  PLAN: PT FREQUENCY: 1x/week  PT DURATION: 6 weeks  PLANNED INTERVENTIONS: Therapeutic exercises, Therapeutic activity, Neuro Muscular re-education, Patient/Family education, Joint mobilization, Aquatic Therapy, Dry  Needling, Spinal mobilization, Cryotherapy, Moist heat, Taping, and Manual therapy  PLAN FOR NEXT SESSION: continue with manual therapy to gluteal; continue with trigger point dry needling. Continue with moderate core strengthening. Review gym activity that the patient is doing.    Carney Living, PT DPT  12/24/2021, 11:45 AM

## 2022-01-01 ENCOUNTER — Other Ambulatory Visit: Payer: Self-pay

## 2022-01-01 ENCOUNTER — Encounter (HOSPITAL_BASED_OUTPATIENT_CLINIC_OR_DEPARTMENT_OTHER): Payer: Self-pay | Admitting: Physical Therapy

## 2022-01-01 ENCOUNTER — Ambulatory Visit (HOSPITAL_BASED_OUTPATIENT_CLINIC_OR_DEPARTMENT_OTHER): Payer: 59 | Admitting: Physical Therapy

## 2022-01-01 DIAGNOSIS — G8929 Other chronic pain: Secondary | ICD-10-CM

## 2022-01-01 DIAGNOSIS — M62830 Muscle spasm of back: Secondary | ICD-10-CM

## 2022-01-01 DIAGNOSIS — M545 Low back pain, unspecified: Secondary | ICD-10-CM | POA: Diagnosis not present

## 2022-01-01 NOTE — Therapy (Signed)
OUTPATIENT PHYSICAL THERAPY THORACOLUMBAR  Treatment    Patient Name: Michael Collier MRN: [id number] DOB:20-Apr-1981, 41 y.o., male Today's Date: 01/02/2022   PT End of Session - 01/02/22 1131     Visit Number 3    Number of Visits 6    Date for PT Re-Evaluation 01/30/22    Authorization Type [employer]    PT Start Time 1305    PT Stop Time 1335    PT Time Calculation (min) 30 min    Activity Tolerance Patient tolerated treatment well    Behavior During Therapy [facility] for tasks assessed/performed               Past Medical History:  Diagnosis Date   Asthma    Migraine    Past Surgical History:  Procedure Laterality Date   None     Patient Active Problem List   Diagnosis Date Noted   Hyperglycemia 02/24/2020   GERD (gastroesophageal reflux disease) 02/23/2020   Dyslipidemia 02/23/2020   Essential hypertension 02/23/2020   Allergic rhinitis 02/23/2020   Degenerative disc disease, lumbar 05/12/2018   Migraine     PCP: Ardith Dark, MD  REFERRING PROVIDER: Ardith Dark, MD  REFERRING DIAG: Low Back Pain   THERAPY DIAG:  Chronic bilateral low back pain without sciatica  Muscle spasm of back  ONSET DATE:   SUBJECTIVE:                                                                                                                                                                                           SUBJECTIVE STATEMENT: Patient had some mild tightness and achyness on the right side of his back on about Sunday or Monday. Today it is back to good.    PERTINENT HISTORY:  Migraines,   PAIN:  Are you having pain? No NPRS scale: 0/10  Pain location: Left sided lower back  Pain orientation: Left  PAIN TYPE: aching Pain description: intermittent  Aggravating factors: Standing and walking  Relieving factors: rest   PRECAUTIONS: None  WEIGHT BEARING RESTRICTIONS No  FALLS:  Has patient fallen in last 6 months? No, Number of falls:   LIVING  ENVIRONMENT: Nothing pertinent  OCCUPATION: Emergency planning/management officer   Recreation: Goes to [employer] Patient has lost 40 lbs over the past year   PLOF: Independent  PATIENT GOALS :  To have less pain    OBJECTIVE:     TODAY'S TREATMENT  2/22 Treatment instructions: Expect mild to moderate muscle soreness. S/S of pneumothorax if dry needled over a lung field, and to seek immediate medical attention should they occur. Patient verbalized understanding of  these instructions and education.  Patient Consent Given: Yes Education handout provided: Previously provided Muscles treated: left glut medius using a .30x50 2 spots  Electrical stimulation performed: No Parameters: N/A Treatment response/outcome: Twitch response elicited and Palpable decrease in muscle tension   Manual: Trigger point release to the gluteal and assessment of trigger point in the right parapsinals    2/14 Treatment instructions: Expect mild to moderate muscle soreness. S/S of pneumothorax if dry needled over a lung field, and to seek immediate medical attention should they occur. Patient verbalized understanding of these instructions and education.  Patient Consent Given: Yes Education handout provided: Previously provided Muscles treated: left glut medius using a .30x50 2 spots  Electrical stimulation performed: No Parameters: N/A Treatment response/outcome: Twitch response elicited and Palpable decrease in muscle tension   Manual: Trigger point release to the gluteal   Exercises:  Supine: bridge 3x10  Clamshell x30 blue   Cable machine:  Chop 2x10 15lbs  Pallof press 210 15 lbs   PATIENT EDUCATION:  Education details: benefits and risk of TPDN, symptom management POC  Person educated: Patient Education method: Explanation, Demonstration, Tactile cues, and Verbal cues Education comprehension: verbalized understanding, returned demonstration, verbal cues required, tactile cues required, and needs further  education   HOME EXERCISE PROGRAM: Access Code: [email or web address] URL: [email or web address] Date: 12/19/2021 Prepared by: Lorayne Bender  Exercises Supine Piriformis Stretch with Foot on Ground - 1 x daily - 7 x weekly - 3 sets - 10 reps Supine Bridge - 1 x daily - 7 x weekly - 3 sets - 10 reps Hooklying Clamshell with Resistance - 1 x daily - 7 x weekly - 3 sets - 10 reps Standing Glute Med Mobilization with Small Ball on Wall - 1 x daily - 7 x weekly - 3 sets - 10 reps   ASSESSMENT:  CLINICAL IMPRESSION: The patient continues to have less pain. He had some pain on the right side the other day. He did not have any trigger points on that side today. He had a great twitch response in the left gluteal. Therapy performed manual therapy on the area. Therapy will continue to progress.   Objective impairments include decreased activity tolerance, increased fascial restrictions, and pain. These impairments are limiting patient from occupation. Personal factor are not affecting patient's functional outcome. Patient will benefit from skilled PT to address above impairments and improve overall function.  REHAB POTENTIAL: Excellent  CLINICAL DECISION MAKING: Stable/uncomplicated  EVALUATION COMPLEXITY: Low   GOALS: Goals reviewed with patient? Yes  SHORT TERM GOALS:  STG=LTG  LONG TERM GOALS:   LTG Name Target Date Goal status  1 Patient will sit in his car at wor without pain  Baseline: 02/13/2022 INITIAL  2 Patient will report no tenderness to palpation in the gluteal in order to perform daily activity without pain  Baseline: 02/13/2022 INITIAL  3 Patient will be independent with home exercise program to prevent further flair-ups Baseline: 02/13/2022 INITIAL  4 Patient will return to the gym without back pain  Baseline: 02/13/2022 INITIAL  PLAN: PT FREQUENCY: 1x/week  PT DURATION: 6 weeks  PLANNED INTERVENTIONS: Therapeutic exercises, Therapeutic activity, Neuro Muscular  re-education, Patient/Family education, Joint mobilization, Aquatic Therapy, Dry Needling, Spinal mobilization, Cryotherapy, Moist heat, Taping, and Manual therapy  PLAN FOR NEXT SESSION: continue with manual therapy to gluteal; continue with trigger point dry needling. Continue with moderate core strengthening. Review gym activity that the patient is doing.    Dessie Coma, PT DPT  01/02/2022,  11:33 AM

## 2022-01-02 ENCOUNTER — Encounter (HOSPITAL_BASED_OUTPATIENT_CLINIC_OR_DEPARTMENT_OTHER): Payer: Self-pay | Admitting: Physical Therapy

## 2022-01-08 ENCOUNTER — Ambulatory Visit (HOSPITAL_BASED_OUTPATIENT_CLINIC_OR_DEPARTMENT_OTHER): Payer: 59 | Attending: Sports Medicine | Admitting: Physical Therapy

## 2022-01-08 ENCOUNTER — Other Ambulatory Visit: Payer: Self-pay

## 2022-01-08 ENCOUNTER — Encounter (HOSPITAL_BASED_OUTPATIENT_CLINIC_OR_DEPARTMENT_OTHER): Payer: Self-pay | Admitting: Physical Therapy

## 2022-01-08 DIAGNOSIS — M545 Low back pain, unspecified: Secondary | ICD-10-CM

## 2022-01-08 DIAGNOSIS — G8929 Other chronic pain: Secondary | ICD-10-CM | POA: Insufficient documentation

## 2022-01-08 DIAGNOSIS — M5136 Other intervertebral disc degeneration, lumbar region: Secondary | ICD-10-CM | POA: Diagnosis not present

## 2022-01-08 DIAGNOSIS — M62830 Muscle spasm of back: Secondary | ICD-10-CM | POA: Insufficient documentation

## 2022-01-08 NOTE — Therapy (Signed)
?OUTPATIENT PHYSICAL THERAPY THORACOLUMBAR  ?Treatment  ? ? ?Patient Name: Michael Collier ?MRN: [id number] ?DOB:08/30/1981, 41 y.o., male ?Today's Date: 01/08/2022 ? ? ? ? ? ? ?Past Medical History:  ?Diagnosis Date  ? Asthma   ? Migraine   ? ?Past Surgical History:  ?Procedure Laterality Date  ? None    ? ?Patient Active Problem List  ? Diagnosis Date Noted  ? Hyperglycemia 02/24/2020  ? GERD (gastroesophageal reflux disease) 02/23/2020  ? Dyslipidemia 02/23/2020  ? Essential hypertension 02/23/2020  ? Allergic rhinitis 02/23/2020  ? Degenerative disc disease, lumbar 05/12/2018  ? Migraine   ? ? ?PCP: Vivi Barrack, MD ? ?REFERRING PROVIDER: Silverio Decamp,* ? ?REFERRING DIAG: Low Back Pain  ? ?THERAPY DIAG:  ?No diagnosis found. ? ?ONSET DATE:  ? ?SUBJECTIVE:                                                                                                                                                                                          ? ?SUBJECTIVE STATEMENT: ?The patient reports he had been doing really well but Monday he had an acute flair up of his left side of his lower back. He has more pain when he bends forward.  ?PERTINENT HISTORY:  ?Migraines,  ? ?PAIN:  ?Are you having pain? No ?NPRS scale: 0/10  ?Pain location: Left sided lower back  ?Pain orientation: Left  ?PAIN TYPE: aching ?Pain description: intermittent  ?Aggravating factors: Standing and walking  ?Relieving factors: rest  ? ?PRECAUTIONS: None ? ?WEIGHT BEARING RESTRICTIONS No ? ?FALLS:  ?Has patient fallen in last 6 months? No, Number of falls:  ? ?LIVING ENVIRONMENT: ?Nothing pertinent  ?OCCUPATION: Engineer, structural  ? ?Recreation: Goes to [employer]. Patient has lost 40 lbs over the past year  ? ?PLOF: Independent ? ?PATIENT GOALS :  ?To have less pain  ? ? ?OBJECTIVE:  ? ? ? ?TODAY'S TREATMENT  ?3/1 ? ?Treatment instructions: Expect mild to moderate muscle soreness. S/S of pneumothorax if dry needled over a lung field, and to seek  immediate medical attention should they occur. Patient verbalized understanding of these instructions and education. ? ?Patient Consent Given: Yes ?Education handout provided: Previously provided ?Muscles treated: left glut medius using a .30x50 3 spots 2 spots in the lower lumbar multifidi using a .30x50 needle  ?Electrical stimulation performed: No ?Parameters: N/A ?Treatment response/outcome: Twitch response elicited and Palpable decrease in muscle tension  ? ?Manual: Trigger point release to the gluteal and assessment of trigger point in the right parapsinals  ?LAD grade III and IV ? ?Prayer stretch and lateral prayer stretch 3x20 sec hold ?Piriformis stretch 2x20  sec hold  ? ? ? ?2/22 ?Treatment instructions: Expect mild to moderate muscle soreness. S/S of pneumothorax if dry needled over a lung field, and to seek immediate medical attention should they occur. Patient verbalized understanding of these instructions and education. ? ?Patient Consent Given: Yes ?Education handout provided: Previously provided ?Muscles treated: left glut medius using a .30x50 2 spots  ?Electrical stimulation performed: No ?Parameters: N/A ?Treatment response/outcome: Twitch response elicited and Palpable decrease in muscle tension  ? ?Manual: Trigger point release to the gluteal and assessment of trigger point in the right parapsinals  ? ? ?2/14 ?=PATIENT EDUCATION:  ?Education details: benefits and risk of TPDN, reviewed technique with stretches  ?Person educated: Patient ?Education method: Explanation, Demonstration, Tactile cues, and Verbal cues ?Education comprehension: verbalized understanding, returned demonstration, verbal cues required, tactile cues required, and needs further education ? ? ?HOME EXERCISE PROGRAM: ?Access Code: [email or web address] ?URL: [email or web address] ?Date: 12/19/2021 ?Prepared by: Carolyne Littles ? ?Exercises ?Supine Piriformis Stretch with Foot on Ground - 1 x daily - 7 x weekly - 3 sets - 10  reps ?Supine Bridge - 1 x daily - 7 x weekly - 3 sets - 10 reps ?Hooklying Clamshell with Resistance - 1 x daily - 7 x weekly - 3 sets - 10 reps ?Standing Glute Med Mobilization with Small Ball on Wall - 1 x daily - 7 x weekly - 3 sets - 10 reps ? ? ?ASSESSMENT: ? ?CLINICAL IMPRESSION: ?The patient had more tightness in his back today and continues to have trigger points in his gluteal. He may try to go to his chiropractor. Therapy reviewed some stretching with hi. We also perfromed ?Objective impairments include decreased activity tolerance, increased fascial restrictions, and pain. These impairments are limiting patient from occupation. Personal factor are not affecting patient's functional outcome. Patient will benefit from skilled PT to address above impairments and improve overall function. ? ?REHAB POTENTIAL: Excellent ? ?CLINICAL DECISION MAKING: Stable/uncomplicated ? ?EVALUATION COMPLEXITY: Low ? ? ?GOALS: ?Goals reviewed with patient? Yes ? ?SHORT TERM GOALS: ? ?STG=LTG ? ?LONG TERM GOALS:  ? ?LTG Name Target Date Goal status  ?1 Patient will sit in his car at wor without pain  ?Baseline: 02/19/2022 INITIAL  ?2 Patient will report no tenderness to palpation in the gluteal in order to perform daily activity without pain  ?Baseline: 02/19/2022 INITIAL  ?3 Patient will be independent with home exercise program to prevent further flair-ups ?Baseline: 02/19/2022 INITIAL  ?4 Patient will return to the gym without back pain  ?Baseline: 02/19/2022 INITIAL  ?PLAN: ?PT FREQUENCY: 1x/week ? ?PT DURATION: 6 weeks ? ?PLANNED INTERVENTIONS: Therapeutic exercises, Therapeutic activity, Neuro Muscular re-education, Patient/Family education, Joint mobilization, Aquatic Therapy, Dry Needling, Spinal mobilization, Cryotherapy, Moist heat, Taping, and Manual therapy ? ?PLAN FOR NEXT SESSION: continue with manual therapy to gluteal; continue with trigger point dry needling. Continue with moderate core strengthening. Review gym  activity that the patient is doing.  ? ? ?Carney Living, PT DPT  ?01/08/2022, 11:41 AM  ?

## 2022-01-16 ENCOUNTER — Other Ambulatory Visit: Payer: Self-pay

## 2022-01-16 ENCOUNTER — Ambulatory Visit (HOSPITAL_BASED_OUTPATIENT_CLINIC_OR_DEPARTMENT_OTHER): Payer: 59 | Admitting: Physical Therapy

## 2022-01-16 DIAGNOSIS — M545 Low back pain, unspecified: Secondary | ICD-10-CM | POA: Diagnosis not present

## 2022-01-16 DIAGNOSIS — M62830 Muscle spasm of back: Secondary | ICD-10-CM

## 2022-01-16 NOTE — Therapy (Signed)
?OUTPATIENT PHYSICAL THERAPY THORACOLUMBAR  ?Treatment  ? ? ?Patient Name: Michael Collier ?MRN: [id number] ?DOB:04/12/1981, 41 y.o., male ?Today's Date: 01/16/2022 ? ? PT End of Session - 01/16/22 0949   ? ? Visit Number 5   ? Number of Visits 6   ? Date for PT Re-Evaluation 01/30/22   ? Authorization Type UHC   ? PT Start Time 0845   ? PT Stop Time 0925   ? PT Time Calculation (min) 40 min   ? Activity Tolerance Patient tolerated treatment well   ? Behavior During Therapy [facility] for tasks assessed/performed   ? ?  ?  ? ?  ? ? ? ? ? ?Past Medical History:  ?Diagnosis Date  ? Asthma   ? Migraine   ? ?Past Surgical History:  ?Procedure Laterality Date  ? None    ? ?Patient Active Problem List  ? Diagnosis Date Noted  ? Hyperglycemia 02/24/2020  ? GERD (gastroesophageal reflux disease) 02/23/2020  ? Dyslipidemia 02/23/2020  ? Essential hypertension 02/23/2020  ? Allergic rhinitis 02/23/2020  ? Degenerative disc disease, lumbar 05/12/2018  ? Migraine   ? ? ?PCP: Ardith Dark, MD ? ?REFERRING PROVIDER: Ardith Dark, MD ? ?REFERRING DIAG: Low Back Pain  ? ?THERAPY DIAG:  ?Chronic bilateral low back pain without sciatica ? ?Muscle spasm of back ? ?ONSET DATE:  ? ?SUBJECTIVE:                                                                                                                                                                                          ? ?SUBJECTIVE STATEMENT: ?The patient has been waking up some mornings sore, but some mornings he has no pain. He is not sure what the trigger is. Yesterday at work he could feel it. He has not been able to exercise the past few days because of work.  ? ?PERTINENT HISTORY:  ?Migraines,  ? ?PAIN:  ?Are you having pain? No ?NPRS scale: 0/10  ?Pain location: Left sided lower back  ?Pain orientation: Left  ?PAIN TYPE: aching ?Pain description: intermittent  ?Aggravating factors: Standing and walking  ?Relieving factors: rest  ? ?PRECAUTIONS: None ? ?WEIGHT BEARING RESTRICTIONS  No ? ?FALLS:  ?Has patient fallen in last 6 months? No, Number of falls:  ? ?LIVING ENVIRONMENT: ?Nothing pertinent  ?OCCUPATION: Emergency planning/management officer  ? ?Recreation: Goes to [employer] Patient has lost 40 lbs over the past year  ? ?PLOF: Independent ? ?PATIENT GOALS :  ?To have less pain  ? ? ?OBJECTIVE:  ? ? ? ?TODAY'S TREATMENT  ?3/9 ?Treatment instructions: Expect mild to moderate muscle soreness. S/S of pneumothorax  if dry needled over a lung field, and to seek immediate medical attention should they occur. Patient verbalized understanding of these instructions and education. ? ?Patient Consent Given: Yes ?Education handout provided: Previously provided ?Muscles treated: left glut medius using a .30x50 3 spots; 2 spots in the lower lumbar multifidi using a .30x50 needle  ?Electrical stimulation performed: No ?Parameters: N/A ?Treatment response/outcome: Twitch response elicited and Palpable decrease in muscle tension  ? ?Manual: Trigger point release to the gluteal and assessment of trigger point in the right parapsinals  ?LAD grade III and IV ? ?Piriformis stretch 2x20 sec hold  ? ?Brdige with band x30 bluw  ? ?Clamshell x30 blue  ? ?Nu-step x5 min L5 UE/LE  ? ? ? ?3/1 ? ?Treatment instructions: Expect mild to moderate muscle soreness. S/S of pneumothorax if dry needled over a lung field, and to seek immediate medical attention should they occur. Patient verbalized understanding of these instructions and education. ? ?Patient Consent Given: Yes ?Education handout provided: Previously provided ?Muscles treated: left glut medius using a .30x50 3 spots 2 spots in the lower lumbar multifidi using a .30x50 needle  ?Electrical stimulation performed: No ?Parameters: N/A ?Treatment response/outcome: Twitch response elicited and Palpable decrease in muscle tension  ? ?Manual: Trigger point release to the gluteal and assessment of trigger point in the right parapsinals  ?LAD grade III and IV ? ?Prayer stretch and lateral prayer  stretch 3x20 sec hold ?Piriformis stretch 2x20 sec hold  ? ? ? ?2/22 ?Treatment instructions: Expect mild to moderate muscle soreness. S/S of pneumothorax if dry needled over a lung field, and to seek immediate medical attention should they occur. Patient verbalized understanding of these instructions and education. ? ?Patient Consent Given: Yes ?Education handout provided: Previously provided ?Muscles treated: left glut medius using a .30x50 2 spots  ?Electrical stimulation performed: No ?Parameters: N/A ?Treatment response/outcome: Twitch response elicited and Palpable decrease in muscle tension  ? ?Manual: Trigger point release to the gluteal and assessment of trigger point in the right parapsinals  ? ? ?2/14 ?=PATIENT EDUCATION:  ?Education details: benefits and risk of TPDN, reviewed technique with stretches  ?Person educated: Patient ?Education method: Explanation, Demonstration, Tactile cues, and Verbal cues ?Education comprehension: verbalized understanding, returned demonstration, verbal cues required, tactile cues required, and needs further education ? ? ?HOME EXERCISE PROGRAM: ?Access Code: [email or web address] ?URL: [email or web address] ?Date: 12/19/2021 ?Prepared by: Lorayne Benderavid Kaelei Wheeler ? ?Exercises ?Supine Piriformis Stretch with Foot on Ground - 1 x daily - 7 x weekly - 3 sets - 10 reps ?Supine Bridge - 1 x daily - 7 x weekly - 3 sets - 10 reps ?Hooklying Clamshell with Resistance - 1 x daily - 7 x weekly - 3 sets - 10 reps ?Standing Glute Med Mobilization with Small Ball on Wall - 1 x daily - 7 x weekly - 3 sets - 10 reps ? ? ?ASSESSMENT: ? ?CLINICAL IMPRESSION: ?The patient continues to have a spot that lingers. He continues to have a moderate spasm in his gluteal and a small spasm in his lumbar paraspinal. He may see his chiropractor next week to see if that can work that spot out. We reviewed some light exercises he should do the next few days following the needling. He will go to the gym today.   ? ? ?Objective impairments include decreased activity tolerance, increased fascial restrictions, and pain. These impairments are limiting patient from occupation. Personal factor are not affecting patient's functional outcome. Patient will benefit from skilled PT to address  above impairments and improve overall function. ? ?REHAB POTENTIAL: Excellent ? ?CLINICAL DECISION MAKING: Stable/uncomplicated ? ?EVALUATION COMPLEXITY: Low ? ? ?GOALS: ?Goals reviewed with patient? Yes ? ?SHORT TERM GOALS: ? ?STG=LTG ? ?LONG TERM GOALS:  ? ?LTG Name Target Date Goal status  ?1 Patient will sit in his car at wor without pain  ?Baseline: 02/27/2022 INITIAL  ?2 Patient will report no tenderness to palpation in the gluteal in order to perform daily activity without pain  ?Baseline: 02/27/2022 INITIAL  ?3 Patient will be independent with home exercise program to prevent further flair-ups ?Baseline: 02/27/2022 INITIAL  ?4 Patient will return to the gym without back pain  ?Baseline: 02/27/2022 INITIAL  ?PLAN: ?PT FREQUENCY: 1x/week ? ?PT DURATION: 6 weeks ? ?PLANNED INTERVENTIONS: Therapeutic exercises, Therapeutic activity, Neuro Muscular re-education, Patient/Family education, Joint mobilization, Aquatic Therapy, Dry Needling, Spinal mobilization, Cryotherapy, Moist heat, Taping, and Manual therapy ? ?PLAN FOR NEXT SESSION: continue with manual therapy to gluteal; continue with trigger point dry needling. Continue with moderate core strengthening. Review gym activity that the patient is doing.  ? ? ?Dessie Coma, PT DPT  ?01/16/2022, 9:50 AM  ?

## 2022-01-31 ENCOUNTER — Other Ambulatory Visit: Payer: Self-pay

## 2022-01-31 ENCOUNTER — Ambulatory Visit (HOSPITAL_BASED_OUTPATIENT_CLINIC_OR_DEPARTMENT_OTHER): Payer: 59 | Admitting: Physical Therapy

## 2022-01-31 DIAGNOSIS — G8929 Other chronic pain: Secondary | ICD-10-CM

## 2022-01-31 DIAGNOSIS — M62830 Muscle spasm of back: Secondary | ICD-10-CM

## 2022-01-31 DIAGNOSIS — M545 Low back pain, unspecified: Secondary | ICD-10-CM | POA: Diagnosis not present

## 2022-02-01 ENCOUNTER — Encounter (HOSPITAL_BASED_OUTPATIENT_CLINIC_OR_DEPARTMENT_OTHER): Payer: Self-pay | Admitting: Physical Therapy

## 2022-02-01 NOTE — Therapy (Signed)
?OUTPATIENT PHYSICAL THERAPY THORACOLUMBAR  ?Treatment  ? ? ?Patient Name: Michael Collier ?MRN: [id number] ?DOB:1980/12/23, 41 y.o., male ?Today's Date: 02/01/2022 ? ? PT End of Session - 02/01/22 [id number]   ? ? Visit Number 6   ? Number of Visits 12   ? Date for PT Re-Evaluation 03/15/22   ? Authorization Type UHC   ? PT Start Time 1300   ? PT Stop Time 1340   ? PT Time Calculation (min) 40 min   ? Activity Tolerance Patient tolerated treatment well   ? Behavior During Therapy [facility] for tasks assessed/performed   ? ?  ?  ? ?  ? ? ? ? ? ?Past Medical History:  ?Diagnosis Date  ? Asthma   ? Migraine   ? ?Past Surgical History:  ?Procedure Laterality Date  ? None    ? ?Patient Active Problem List  ? Diagnosis Date Noted  ? Hyperglycemia 02/24/2020  ? GERD (gastroesophageal reflux disease) 02/23/2020  ? Dyslipidemia 02/23/2020  ? Essential hypertension 02/23/2020  ? Allergic rhinitis 02/23/2020  ? Degenerative disc disease, lumbar 05/12/2018  ? Migraine   ? ? ?PCP: Vivi Barrack, MD ? ?REFERRING PROVIDER: Vivi Barrack, MD ? ?REFERRING DIAG: Low Back Pain  ? ?THERAPY DIAG:  ?Chronic bilateral low back pain without sciatica ? ?Muscle spasm of back ? ?ONSET DATE:  ? ?SUBJECTIVE:                                                                                                                                                                                          ? ?SUBJECTIVE STATEMENT: ?The patient played golf and now his back is back to sore. He is still working out. He is still stiff in the morning.  ? ?PERTINENT HISTORY:  ?Migraines,  ? ?PAIN:  ?Are you having pain? No ?NPRS scale: 0/10  ?Pain location: Left sided lower back  ?Pain orientation: Left  ?PAIN TYPE: aching ?Pain description: intermittent  ?Aggravating factors: Standing and walking  ?Relieving factors: rest  ? ?PRECAUTIONS: None ? ?WEIGHT BEARING RESTRICTIONS No ? ?FALLS:  ?Has patient fallen in last 6 months? No, Number of falls:  ? ?LIVING ENVIRONMENT: ?Nothing  pertinent  ?OCCUPATION: Engineer, structural  ? ?Recreation: Goes to [employer]. Patient has lost 40 lbs over the past year  ? ?PLOF: Independent ? ?PATIENT GOALS :  ?To have less pain  ? ? ?OBJECTIVE:  ?MMT: WNL ?Full movement of the low back  ?Large trigger point in the gluteal  ? ? ? ?TODAY'S TREATMENT  ?3/25 ?Treatment instructions: Expect mild to moderate muscle soreness. S/S of pneumothorax if dry needled over a  lung field, and to seek immediate medical attention should they occur. Patient verbalized understanding of these instructions and education. ? ?Patient Consent Given: Yes ?Education handout provided: Previously provided ?Muscles treated: left glut medius using a .30x50 2 spots in the gluteal  ?Electrical stimulation performed: yes  ?Parameters: 10 bps  ?Treatment response/outcome: Twitch response elicited and Palpable decrease in muscle tension  ? ?Nu-step 5 min ?Reviewed exercises to continue  ? ? ?3/9 ?Treatment instructions: Expect mild to moderate muscle soreness. S/S of pneumothorax if dry needled over a lung field, and to seek immediate medical attention should they occur. Patient verbalized understanding of these instructions and education. ? ?Patient Consent Given: Yes ?Education handout provided: Previously provided ?Muscles treated: left glut medius using a .30x50 3 spots; 2 spots in the lower lumbar multifidi using a .30x50 needle  ?Electrical stimulation performed: No ?Parameters: N/A ?Treatment response/outcome: Twitch response elicited and Palpable decrease in muscle tension  ? ?Manual: Trigger point release to the gluteal and assessment of trigger point in the right parapsinals  ?LAD grade III and IV ? ?Piriformis stretch 2x20 sec hold  ? ?Brdige with band x30 bluw  ? ?Clamshell x30 blue  ? ?Nu-step x5 min L5 UE/LE  ? ? ? ?3/1 ? ?Treatment instructions: Expect mild to moderate muscle soreness. S/S of pneumothorax if dry needled over a lung field, and to seek immediate medical attention should  they occur. Patient verbalized understanding of these instructions and education. ? ?Patient Consent Given: Yes ?Education handout provided: Previously provided ?Muscles treated: left glut medius using a .30x50 3 spots 2 spots in the lower lumbar multifidi using a .30x50 needle  ?Electrical stimulation performed: No ?Parameters: N/A ?Treatment response/outcome: Twitch response elicited and Palpable decrease in muscle tension  ? ?Manual: Trigger point release to the gluteal and assessment of trigger point in the right parapsinals  ?LAD grade III and IV ? ?Prayer stretch and lateral prayer stretch 3x20 sec hold ?Piriformis stretch 2x20 sec hold  ? ? ? ?2/22 ?Treatment instructions: Expect mild to moderate muscle soreness. S/S of pneumothorax if dry needled over a lung field, and to seek immediate medical attention should they occur. Patient verbalized understanding of these instructions and education. ? ?Patient Consent Given: Yes ?Education handout provided: Previously provided ?Muscles treated: left glut medius using a .30x50 2 spots  ?Electrical stimulation performed: No ?Parameters: N/A ?Treatment response/outcome: Twitch response elicited and Palpable decrease in muscle tension  ? ?Manual: Trigger point release to the gluteal and assessment of trigger point in the right parapsinals  ? ? ?2/14 ?=PATIENT EDUCATION:  ?Education details: benefits and risk of TPDN, reviewed technique with stretches  ?Person educated: Patient ?Education method: Explanation, Demonstration, Tactile cues, and Verbal cues ?Education comprehension: verbalized understanding, returned demonstration, verbal cues required, tactile cues required, and needs further education ? ? ?HOME EXERCISE PROGRAM: ?Access Code: [place] ?URL: [email or web address] ?Date: 12/19/2021 ?Prepared by: Carolyne Littles ? ?Exercises ?Supine Piriformis Stretch with Foot on Ground - 1 x daily - 7 x weekly - 3 sets - 10 reps ?Supine Bridge - 1 x daily - 7 x  weekly - 3 sets - 10 reps ?Hooklying Clamshell with Resistance - 1 x daily - 7 x weekly - 3 sets - 10 reps ?Standing Glute Med Mobilization with Small Ball on Wall - 1 x daily - 7 x weekly - 3 sets - 10 reps ? ? ?ASSESSMENT: ? ?CLINICAL IMPRESSION: ?The patients back has not changed he still is having a spot in his gluteal.  He will trial a Restaurant manager, fast food. We will leave his case open for a few weeks, but at this point PT does not seem to be helping his trigger point. It has improved since the initial eval but he continues to have symptoms at times. He was advised to come back if it starts getting worse. He will likely follow up with the MD depending on how the manipulation works. Therapy trialed exercises with e-stim  ? ?Objective impairments include decreased activity tolerance, increased fascial restrictions, and pain. These impairments are limiting patient from occupation. Personal factor are not affecting patient's functional outcome. Patient will benefit from skilled PT to address above impairments and improve overall function. ? ?REHAB POTENTIAL: Excellent ? ?CLINICAL DECISION MAKING: Stable/uncomplicated ? ?EVALUATION COMPLEXITY: Low ? ? ?GOALS: ?Goals reviewed with patient? Yes ? ?SHORT TERM GOALS: ? ?STG=LTG ? ?LONG TERM GOALS:  ? ?LTG Name Target Date Goal status  ?1 Patient will sit in his car at wor without pain  ?Baseline: 03/15/2022 Less pain at work   ?2 Patient will report no tenderness to palpation in the gluteal in order to perform daily activity without pain  ?Baseline: 03/15/2022 Continues to have tenderness to palpation   ?3 Patient will be independent with home exercise program to prevent further flair-ups ?Baseline: 03/15/2022 Independent with HEP Achieved   ?4 Patient will return to the gym without back pain  ?Baseline: 03/15/2022 He is back tot he gym but has low back pain at times   ?PLAN: ?PT FREQUENCY: 1x/week ? ?PT DURATION: 6 weeks ? ?PLANNED INTERVENTIONS: Therapeutic exercises, Therapeutic  activity, Neuro Muscular re-education, Patient/Family education, Joint mobilization, Aquatic Therapy, Dry Needling, Spinal mobilization, Cryotherapy, Moist heat, Taping, and Manual therapy ? ? ?PLAN FOR NEXT

## 2022-02-26 ENCOUNTER — Encounter (HOSPITAL_BASED_OUTPATIENT_CLINIC_OR_DEPARTMENT_OTHER): Payer: Self-pay | Admitting: Physical Therapy

## 2022-02-26 ENCOUNTER — Ambulatory Visit (HOSPITAL_BASED_OUTPATIENT_CLINIC_OR_DEPARTMENT_OTHER): Payer: 59 | Attending: Sports Medicine | Admitting: Physical Therapy

## 2022-02-26 DIAGNOSIS — M545 Low back pain, unspecified: Secondary | ICD-10-CM | POA: Diagnosis present

## 2022-02-26 DIAGNOSIS — M62830 Muscle spasm of back: Secondary | ICD-10-CM | POA: Insufficient documentation

## 2022-02-26 DIAGNOSIS — G8929 Other chronic pain: Secondary | ICD-10-CM | POA: Insufficient documentation

## 2022-02-26 NOTE — Therapy (Addendum)
OUTPATIENT PHYSICAL THERAPY THORACOLUMBAR  Treatment/P   Patient Name: Michael Collier MRN: [id number] DOB:1981/04/02, 41 y.o., male Today's Date: 02/26/2022   PT End of Session - 02/26/22 1346     Visit Number 7    Number of Visits 12    Date for PT Re-Evaluation 03/15/22    Authorization Type [employer]    PT Start Time 1345    PT Stop Time 1426    PT Time Calculation (min) 41 min    Activity Tolerance Patient tolerated treatment well    Behavior During Therapy [facility] for tasks assessed/performed                Past Medical History:  Diagnosis Date   Asthma    Migraine    Past Surgical History:  Procedure Laterality Date   None     Patient Active Problem List   Diagnosis Date Noted   Hyperglycemia 02/24/2020   GERD (gastroesophageal reflux disease) 02/23/2020   Dyslipidemia 02/23/2020   Essential hypertension 02/23/2020   Allergic rhinitis 02/23/2020   Degenerative disc disease, lumbar 05/12/2018   Migraine     PCP: Vivi Barrack, MD  REFERRING PROVIDER: Vivi Barrack, MD  REFERRING DIAG: Low Back Pain   THERAPY DIAG:  No diagnosis found.  ONSET DATE:   SUBJECTIVE:                                                                                                                                                                                           SUBJECTIVE STATEMENT: The patient played golf and now his back is back to sore. He is still working out. He is still stiff in the morning.   PERTINENT HISTORY:  Migraines,   PAIN:  Are you having pain? No NPRS scale: 0/10  Pain location: Left sided lower back  Pain orientation: Left  PAIN TYPE: aching Pain description: intermittent  Aggravating factors: Standing and walking  Relieving factors: rest   PRECAUTIONS: None  WEIGHT BEARING RESTRICTIONS No  FALLS:  Has patient fallen in last 6 months? No, Number of falls:   LIVING ENVIRONMENT: Nothing pertinent  OCCUPATION: Engineer, structural    Recreation: Goes to [employer]. Patient has lost 40 lbs over the past year   PLOF: Independent  PATIENT GOALS :  To have less pain    OBJECTIVE:  MMT: WNL Full movement of the low back  Large trigger point in the gluteal     TODAY'S TREATMENT  4/20 Treatment instructions: Expect mild to moderate muscle soreness. S/S of pneumothorax if dry needled over a lung field, and to seek immediate medical attention should they  occur. Patient verbalized understanding of these instructions and education.  Patient Consent Given: Yes Education handout provided: Previously provided Muscles treated: left glut medius using a .30x50 3 spots in the gluteal  Electrical stimulation performed: yes  Parameters: 10 bps  Treatment response/outcome: Twitch response elicited and Palpable decrease in muscle tension   Nu-step 5 min Manual: trigger oint release to gluteal; LAD to left LE grade IV  Bridge x30 Blue band clamshell x30   Cable extension with abdominal tightening 2x15 20 lbs  [employer] 2x15 20 lbs    3/25 Treatment instructions: Expect mild to moderate muscle soreness. S/S of pneumothorax if dry needled over a lung field, and to seek immediate medical attention should they occur. Patient verbalized understanding of these instructions and education.  Patient Consent Given: Yes Education handout provided: Previously provided Muscles treated: left glut medius using a .30x50 2 spots in the gluteal  Electrical stimulation performed: yes  Parameters: 10 bps  Treatment response/outcome: Twitch response elicited and Palpable decrease in muscle tension   Nu-step 5 min Reviewed exercises to continue    3/9 Treatment instructions: Expect mild to moderate muscle soreness. S/S of pneumothorax if dry needled over a lung field, and to seek immediate medical attention should they occur. Patient verbalized understanding of these instructions and education.  Patient Consent Given: Yes Education  handout provided: Previously provided Muscles treated: left glut medius using a .30x50 3 spots; 2 spots in the lower lumbar multifidi using a .30x50 needle  Electrical stimulation performed: No Parameters: N/A Treatment response/outcome: Twitch response elicited and Palpable decrease in muscle tension   Manual: Trigger point release to the gluteal and assessment of trigger point in the right parapsinals  LAD grade III and IV  Piriformis stretch 2x20 sec hold   Brdige with band x30 bluw   Clamshell x30 blue   Nu-step x5 min L5 UE/LE     3/1  Treatment instructions: Expect mild to moderate muscle soreness. S/S of pneumothorax if dry needled over a lung field, and to seek immediate medical attention should they occur. Patient verbalized understanding of these instructions and education.  Patient Consent Given: Yes Education handout provided: Previously provided Muscles treated: left glut medius using a .30x50 3 spots 2 spots in the lower lumbar multifidi using a .30x50 needle  Electrical stimulation performed: No Parameters: N/A Treatment response/outcome: Twitch response elicited and Palpable decrease in muscle tension   Manual: Trigger point release to the gluteal and assessment of trigger point in the right parapsinals  LAD grade III and IV  Prayer stretch and lateral prayer stretch 3x20 sec hold Piriformis stretch 2x20 sec hold     2/22 Treatment instructions: Expect mild to moderate muscle soreness. S/S of pneumothorax if dry needled over a lung field, and to seek immediate medical attention should they occur. Patient verbalized understanding of these instructions and education.  Patient Consent Given: Yes Education handout provided: Previously provided Muscles treated: left glut medius using a .30x50 2 spots  Electrical stimulation performed: No Parameters: N/A Treatment response/outcome: Twitch response elicited and Palpable decrease in muscle tension   Manual:  Trigger point release to the gluteal and assessment of trigger point in the right parapsinals    2/14 =PATIENT EDUCATION:  Education details: benefits and risk of TPDN, reviewed technique with stretches  Person educated: Patient Education method: Explanation, Demonstration, Tactile cues, and Verbal cues Education comprehension: verbalized understanding, returned demonstration, verbal cues required, tactile cues required, and needs further education   HOME EXERCISE  PROGRAM: Access Code: [email or web address] URL: [email or web address] Date: 12/19/2021 Prepared by: Carolyne Littles  Exercises Supine Piriformis Stretch with Foot on Ground - 1 x daily - 7 x weekly - 3 sets - 10 reps Supine Bridge - 1 x daily - 7 x weekly - 3 sets - 10 reps Hooklying Clamshell with Resistance - 1 x daily - 7 x weekly - 3 sets - 10 reps Standing Glute Med Mobilization with Small Ball on Wall - 1 x daily - 7 x weekly - 3 sets - 10 reps   ASSESSMENT:  CLINICAL IMPRESSION: Te patient comes back in today with is back exacerbated following a long drive. He has spasming in his paraspinals and gluteal today. He had a great twitch with all 3 needles today. Therapy performed soft tissue mobilization to his paraspinals and gluteal. We reviewed core exercises. He will continue to go to the gym. He was advised it may be close to time to go back to the MD for a follow up.  Objective impairments include decreased activity tolerance, increased fascial restrictions, and pain. These impairments are limiting patient from occupation. Personal factor are not affecting patient's functional outcome. Patient will benefit from skilled PT to address above impairments and improve overall function.  REHAB POTENTIAL: Excellent  CLINICAL DECISION MAKING: Stable/uncomplicated  EVALUATION COMPLEXITY: Low   GOALS: Goals reviewed with patient? Yes  SHORT TERM GOALS:  STG=LTG  LONG TERM GOALS:   LTG Name Target Date Goal status   1 Patient will sit in his car at wor without pain  Baseline: 04/09/2022 Less pain at work   2 Patient will report no tenderness to palpation in the gluteal in order to perform daily activity without pain  Baseline: 04/09/2022 Continues to have tenderness to palpation   3 Patient will be independent with home exercise program to prevent further flair-ups Baseline: 04/09/2022 Independent with HEP Achieved   4 Patient will return to the gym without back pain  Baseline: 04/09/2022 He is back tot he gym but has low back pain at times   PLAN: PT FREQUENCY: 1x/week  PT DURATION: 6 weeks  PLANNED INTERVENTIONS: Therapeutic exercises, Therapeutic activity, Neuro Muscular re-education, Patient/Family education, Joint mobilization, Aquatic Therapy, Dry Needling, Spinal mobilization, Cryotherapy, Moist heat, Taping, and Manual therapy   PLAN FOR NEXT SESSION:  continue to work on exercises  Carney Living, PT DPT  02/26/2022, 1:49 PM    PHYSICAL THERAPY DISCHARGE SUMMARY  Visits from Start of Care: 7 Plan: Patient agrees to discharge.  Patient goals were partially met. Patient is being discharged due to meeting the stated rehab goals.  -not returning since last visit.      Lyndee Hensen, PT, DPT 11:43 AM  08/11/22

## 2022-02-27 ENCOUNTER — Encounter (HOSPITAL_BASED_OUTPATIENT_CLINIC_OR_DEPARTMENT_OTHER): Payer: Self-pay | Admitting: Physical Therapy

## 2022-03-13 ENCOUNTER — Ambulatory Visit (HOSPITAL_BASED_OUTPATIENT_CLINIC_OR_DEPARTMENT_OTHER): Payer: 59 | Admitting: Physical Therapy

## 2022-08-04 ENCOUNTER — Encounter: Payer: Self-pay | Admitting: *Deleted

## 2022-08-28 ENCOUNTER — Encounter: Payer: Self-pay | Admitting: Family Medicine

## 2022-08-28 ENCOUNTER — Ambulatory Visit (INDEPENDENT_AMBULATORY_CARE_PROVIDER_SITE_OTHER): Payer: 59 | Admitting: Family Medicine

## 2022-08-28 VITALS — BP 135/84 | HR 69 | Temp 97.3°F | Ht 70.0 in | Wt 242.2 lb

## 2022-08-28 DIAGNOSIS — J309 Allergic rhinitis, unspecified: Secondary | ICD-10-CM

## 2022-08-28 DIAGNOSIS — Z1159 Encounter for screening for other viral diseases: Secondary | ICD-10-CM

## 2022-08-28 DIAGNOSIS — M51369 Other intervertebral disc degeneration, lumbar region without mention of lumbar back pain or lower extremity pain: Secondary | ICD-10-CM

## 2022-08-28 DIAGNOSIS — I10 Essential (primary) hypertension: Secondary | ICD-10-CM

## 2022-08-28 DIAGNOSIS — K219 Gastro-esophageal reflux disease without esophagitis: Secondary | ICD-10-CM

## 2022-08-28 DIAGNOSIS — E785 Hyperlipidemia, unspecified: Secondary | ICD-10-CM | POA: Diagnosis not present

## 2022-08-28 DIAGNOSIS — R739 Hyperglycemia, unspecified: Secondary | ICD-10-CM

## 2022-08-28 DIAGNOSIS — Z0001 Encounter for general adult medical examination with abnormal findings: Secondary | ICD-10-CM

## 2022-08-28 DIAGNOSIS — M5136 Other intervertebral disc degeneration, lumbar region: Secondary | ICD-10-CM

## 2022-08-28 DIAGNOSIS — Z114 Encounter for screening for human immunodeficiency virus [HIV]: Secondary | ICD-10-CM

## 2022-08-28 LAB — COMPREHENSIVE METABOLIC PANEL
ALT: 29 U/L (ref 0–53)
AST: 20 U/L (ref 0–37)
Albumin: 4.6 g/dL (ref 3.5–5.2)
Alkaline Phosphatase: 74 U/L (ref 39–117)
BUN: 16 mg/dL (ref 6–23)
CO2: 28 mEq/L (ref 19–32)
Calcium: 9.5 mg/dL (ref 8.4–10.5)
Chloride: 105 mEq/L (ref 96–112)
Creatinine, Ser: 1.07 mg/dL (ref 0.40–1.50)
GFR: 85.17 mL/min (ref >60.00)
Glucose, Bld: 95 mg/dL (ref 70–99)
Potassium: 4.2 mEq/L (ref 3.5–5.1)
Sodium: 140 mEq/L (ref 135–145)
Total Bilirubin: 0.6 mg/dL (ref 0.2–1.2)
Total Protein: 7.1 g/dL (ref 6.0–8.3)

## 2022-08-28 LAB — CBC
HCT: 42.7 % (ref 39.0–52.0)
Hemoglobin: 14.9 g/dL (ref 13.0–17.0)
MCHC: 34.9 g/dL (ref 30.0–36.0)
MCV: 86.3 fl (ref 78.0–100.0)
Platelets: 178 10*3/uL (ref 150.0–400.0)
RBC: 4.94 Mil/uL (ref 4.22–5.81)
RDW: 13.2 % (ref 11.5–15.5)
WBC: 5.1 10*3/uL (ref 4.0–10.5)

## 2022-08-28 LAB — LIPID PANEL
Cholesterol: 177 mg/dL (ref 0–200)
HDL: 40.2 mg/dL (ref >39.00)
LDL Cholesterol: 109 mg/dL — ABNORMAL HIGH (ref 0–99)
NonHDL: 136.34
Total CHOL/HDL Ratio: 4
Triglycerides: 139 mg/dL (ref 0.0–149.0)
VLDL: 27.8 mg/dL (ref 0.0–40.0)

## 2022-08-28 LAB — HEMOGLOBIN A1C: Hgb A1c MFr Bld: 5.2 % (ref 4.6–6.5)

## 2022-08-28 LAB — TSH: TSH: 2.34 u[IU]/mL (ref 0.35–5.50)

## 2022-08-28 NOTE — Patient Instructions (Signed)
It was very nice to see you today!  We will check blood work today.  Please continue to work on diet and exercise.  I will see back in year for your next physical.  Come back sooner if needed.  Take care, Dr Massimiliano Rohleder  PLEASE NOTE:  If you had any lab tests please let us know if you have not heard back within a few days. You may see your results on mychart before we have a chance to review them but we will give you a call once they are reviewed by us. If we ordered any referrals today, please let us know if you have not heard from their office within the next week.   Please try these tips to maintain a healthy lifestyle:  Eat at least 3 REAL meals and 1-2 snacks per day.  Aim for no more than 5 hours between eating.  If you eat breakfast, please do so within one hour of getting up.   Each meal should contain half fruits/vegetables, one quarter protein, and one quarter carbs (no bigger than a computer mouse)  Cut down on sweet beverages. This includes juice, soda, and sweet tea.   Drink at least 1 glass of water with each meal and aim for at least 8 glasses per day  Exercise at least 150 minutes every week.    Preventive Care 40-64 Years Old, Male Preventive care refers to lifestyle choices and visits with your health care provider that can promote health and wellness. Preventive care visits are also called wellness exams. What can I expect for my preventive care visit? Counseling During your preventive care visit, your health care provider may ask about your: Medical history, including: Past medical problems. Family medical history. Current health, including: Emotional well-being. Home life and relationship well-being. Sexual activity. Lifestyle, including: Alcohol, nicotine or tobacco, and drug use. Access to firearms. Diet, exercise, and sleep habits. Safety issues such as seatbelt and bike helmet use. Sunscreen use. Work and work environment. Physical exam Your health  care provider will check your: Height and weight. These may be used to calculate your BMI (body mass index). BMI is a measurement that tells if you are at a healthy weight. Waist circumference. This measures the distance around your waistline. This measurement also tells if you are at a healthy weight and may help predict your risk of certain diseases, such as type 2 diabetes and high blood pressure. Heart rate and blood pressure. Body temperature. Skin for abnormal spots. What immunizations do I need?  Vaccines are usually given at various ages, according to a schedule. Your health care provider will recommend vaccines for you based on your age, medical history, and lifestyle or other factors, such as travel or where you work. What tests do I need? Screening Your health care provider may recommend screening tests for certain conditions. This may include: Lipid and cholesterol levels. Diabetes screening. This is done by checking your blood sugar (glucose) after you have not eaten for a while (fasting). Hepatitis B test. Hepatitis C test. HIV (human immunodeficiency virus) test. STI (sexually transmitted infection) testing, if you are at risk. Lung cancer screening. Prostate cancer screening. Colorectal cancer screening. Talk with your health care provider about your test results, treatment options, and if necessary, the need for more tests. Follow these instructions at home: Eating and drinking  Eat a diet that includes fresh fruits and vegetables, whole grains, lean protein, and low-fat dairy products. Take vitamin and mineral supplements as recommended by   your health care provider. Do not drink alcohol if your health care provider tells you not to drink. If you drink alcohol: Limit how much you have to 0-2 drinks a day. Know how much alcohol is in your drink. In the [place] one drink equals one 12 oz bottle of beer (355 mL), one 5 oz glass of wine (148 mL), or one 1 oz glass of hard  liquor (44 mL). Lifestyle Brush your teeth every morning and night with fluoride toothpaste. Floss one time each day. Exercise for at least 30 minutes 5 or more days each week. Do not use any products that contain nicotine or tobacco. These products include cigarettes, chewing tobacco, and vaping devices, such as e-cigarettes. If you need help quitting, ask your health care provider. Do not use drugs. If you are sexually active, practice safe sex. Use a condom or other form of protection to prevent STIs. Take aspirin only as told by your health care provider. Make sure that you understand how much to take and what form to take. Work with your health care provider to find out whether it is safe and beneficial for you to take aspirin daily. Find healthy ways to manage stress, such as: Meditation, yoga, or listening to music. Journaling. Talking to a trusted person. Spending time with friends and family. Minimize exposure to UV radiation to reduce your risk of skin cancer. Safety Always wear your seat belt while driving or riding in a vehicle. Do not drive: If you have been drinking alcohol. Do not ride with someone who has been drinking. When you are tired or distracted. While texting. If you have been using any mind-altering substances or drugs. Wear a helmet and other protective equipment during sports activities. If you have firearms in your house, make sure you follow all gun safety procedures. What's next? Go to your health care provider once a year for an annual wellness visit. Ask your health care provider how often you should have your eyes and teeth checked. Stay up to date on all vaccines. This information is not intended to replace advice given to you by your health care provider. Make sure you discuss any questions you have with your health care provider. Document Revised: 04/24/2021 Document Reviewed: 04/24/2021 Elsevier Patient Education  [place].

## 2022-08-28 NOTE — Assessment & Plan Note (Signed)
Stable on omeprazole 20 mg daily as needed.

## 2022-08-28 NOTE — Assessment & Plan Note (Signed)
Check A1c.  Discussed lifestyle modifications. °

## 2022-08-28 NOTE — Assessment & Plan Note (Signed)
Check lipids. Discussed lifestyle modifications.  

## 2022-08-28 NOTE — Assessment & Plan Note (Signed)
At goal today off meds.  We discussed lifestyle modifications.

## 2022-08-28 NOTE — Progress Notes (Signed)
Chief Complaint:  Michael Collier is a 41 y.o. male who presents today for his annual comprehensive physical exam.    Assessment/Plan:  Chronic Problems Addressed Today: Hyperglycemia Check A1c.  Discussed lifestyle modifications.  Allergic rhinitis Stable on Zyrtec 10 mg daily as needed.  Essential hypertension At goal today off meds.  We discussed lifestyle modifications.  Dyslipidemia Check lipids.  Discussed lifestyle modifications.  GERD (gastroesophageal reflux disease) Stable on omeprazole 20 mg daily as needed.  Preventative Healthcare: Check labs today.  Declined flu shot.  Patient Counseling(The following topics were reviewed and/or handout was given):  -Nutrition: Stressed importance of moderation in sodium/caffeine intake, saturated fat and cholesterol, caloric balance, sufficient intake of fresh fruits, vegetables, and fiber.  -Stressed the importance of regular exercise.   -Substance Abuse: Discussed cessation/primary prevention of tobacco, alcohol, or other drug use; driving or other dangerous activities under the influence; availability of treatment for abuse.   -Injury prevention: Discussed safety belts, safety helmets, smoke detector, smoking near bedding or upholstery.   -Sexuality: Discussed sexually transmitted diseases, partner selection, use of condoms, avoidance of unintended pregnancy and contraceptive alternatives.   -Dental health: Discussed importance of regular tooth brushing, flossing, and dental visits.  -Health maintenance and immunizations reviewed. Please refer to Health maintenance section.  Return to care in 1 year for next preventative visit.     Subjective:  HPI:  He has no acute complaints today.   Lifestyle Diet: None specific.  Exercise: Limited due to back pain.      08/28/2022    8:19 AM  Depression screen PHQ 2/9  Decreased Interest 0  Down, Depressed, Hopeless 0  PHQ - 2 Score 0    Health Maintenance Due  Topic Date  Due   Hepatitis C Screening  Never done     ROS: Per HPI, otherwise a complete review of systems was negative.   PMH:  The following were reviewed and entered/updated in epic: Past Medical History:  Diagnosis Date   Asthma    Migraine    Patient Active Problem List   Diagnosis Date Noted   Hyperglycemia 02/24/2020   GERD (gastroesophageal reflux disease) 02/23/2020   Dyslipidemia 02/23/2020   Essential hypertension 02/23/2020   Allergic rhinitis 02/23/2020   Degenerative disc disease, lumbar 05/12/2018   Migraine    Past Surgical History:  Procedure Laterality Date   None      Family History  Problem Relation Age of Onset   Hypertension Mother    Asthma Mother    Hypertension Father    Hyperlipidemia Father    Arthritis Father    Cancer Other     Medications- reviewed and updated Current Outpatient Medications  Medication Sig Dispense Refill   cetirizine (ZYRTEC) 10 MG tablet Take 10 mg by mouth daily as needed for allergies.      IBUPROFEN PO Take by mouth as needed.     Omeprazole 20 MG TBDD Take by mouth.     No current facility-administered medications for this visit.    Allergies-reviewed and updated No Known Allergies  Social History   Socioeconomic History   Marital status: Married    Spouse name: Venetia Night   Number of children: 2   Years of education: college   Highest education level: Not on file  Occupational History    Employer: [employer]  Tobacco Use   Smoking status: Former   Smokeless tobacco: Former    Types: Snuff    Quit date: 10/10/2013  Tobacco comments:    Quit 2014  Substance and Sexual Activity   Alcohol use: Yes    Alcohol/week: 1.0 standard drink of alcohol    Types: 1 Standard drinks or equivalent per week    Comment: Social    Drug use: No   Sexual activity: Not on file  Other Topics Concern   Not on file  Social History Narrative   Patient lives at home with his wife and children Venetia Night)   Patient  works full time for [employer] Engineer, structural.   Education some college.   Left handed.   Caffeine tea three or four when he is at work. Un sweet tea.   Social Determinants of Health   Financial Resource Strain: Not on file  Food Insecurity: Not on file  Transportation Needs: Not on file  Physical Activity: Not on file  Stress: Not on file  Social Connections: Not on file        Objective:  Physical Exam: BP 135/84   Pulse 69   Temp (!) 97.3 F (36.3 C) (Temporal)   Ht 5\' 10"  (1.778 m)   Wt 242 lb 3.2 oz (109.9 kg)   SpO2 100%   BMI 34.75 kg/m   Body mass index is 34.75 kg/m. Wt Readings from Last 3 Encounters:  08/28/22 242 lb 3.2 oz (109.9 kg)  08/23/21 234 lb 4.8 oz (106.3 kg)  02/23/20 238 lb (108 kg)   Gen: NAD, resting comfortably HEENT: TMs normal bilaterally. OP clear. No thyromegaly noted.  CV: RRR with no murmurs appreciated Pulm: NWOB, CTAB with no crackles, wheezes, or rhonchi GI: Normal bowel sounds present. Soft, Nontender, Nondistended. MSK: no edema, cyanosis, or clubbing noted Skin: warm, dry Neuro: CN2-12 grossly intact. Strength 5/5 in upper and lower extremities. Reflexes symmetric and intact bilaterally.  Psych: Normal affect and thought content     Nejla Reasor M. Jerline Pain, MD 08/28/2022 8:46 AM

## 2022-08-28 NOTE — Assessment & Plan Note (Signed)
Stable on Zyrtec 10 mg daily as needed. 

## 2022-08-29 LAB — HEPATITIS C ANTIBODY: Hepatitis C Ab: NONREACTIVE

## 2022-08-29 LAB — HIV ANTIBODY (ROUTINE TESTING W REFLEX): HIV 1&2 Ab, 4th Generation: NONREACTIVE

## 2022-09-04 NOTE — Progress Notes (Signed)
Please inform patient of the following:  Great news! Labs are all stable.  Do not need to make any changes in the treatment plan at this time.  He should continue to work on diet and exercise and we can recheck in a year.

## 2022-11-24 IMAGING — XA Imaging study
2 series · 2 of 2 positions shown · non-contrast
Comparison: none

CLINICAL DATA: Lumbosacral spondylosis without myelopathy. Pain
across the low back. No radicular complaints. Disc degeneration at
L4-5 and L5-S1. Right-sided epidural injection requested at L5-S1.

[Series 1: ortho adipose · 1 of 1 slices shown (1 of 2)]
[im 1/1]
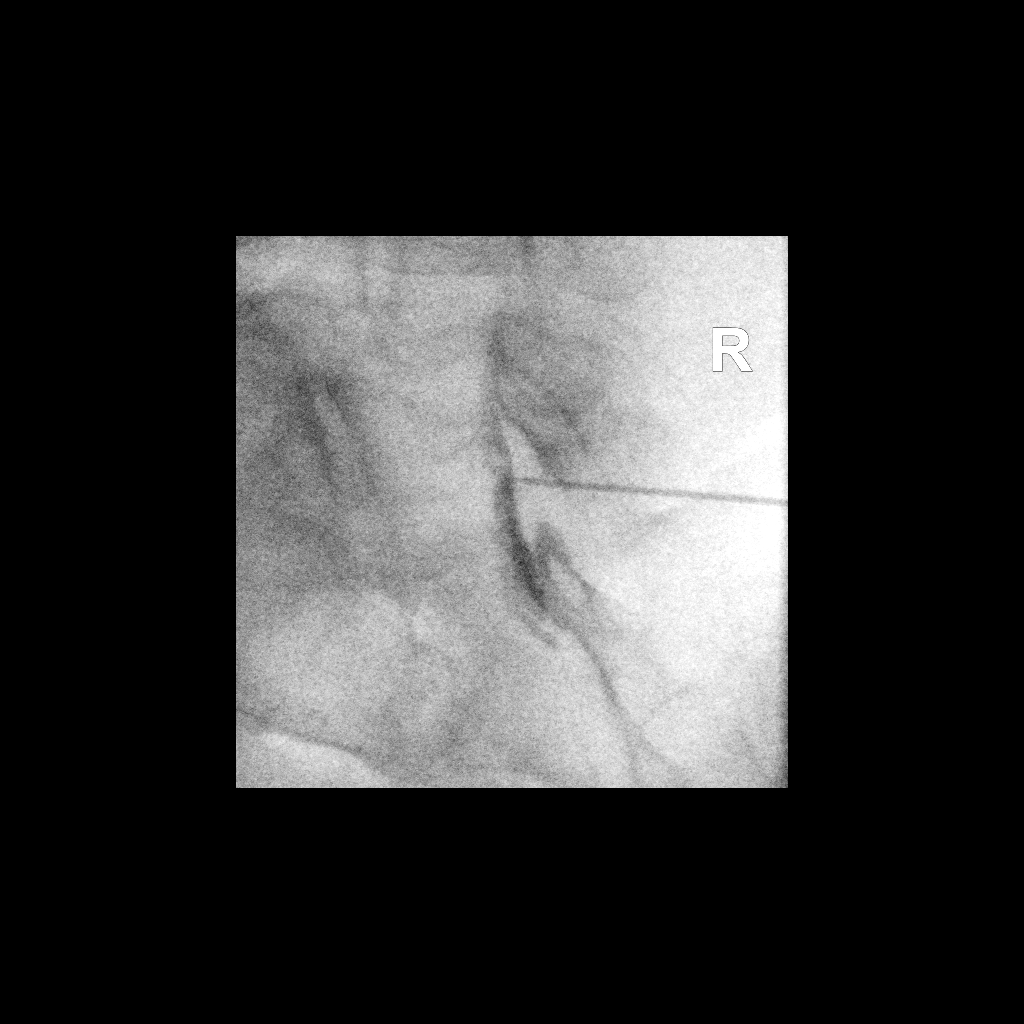

[Series 2: ortho adipose · 1 of 1 slices shown (2 of 2)]
[im 1/1]
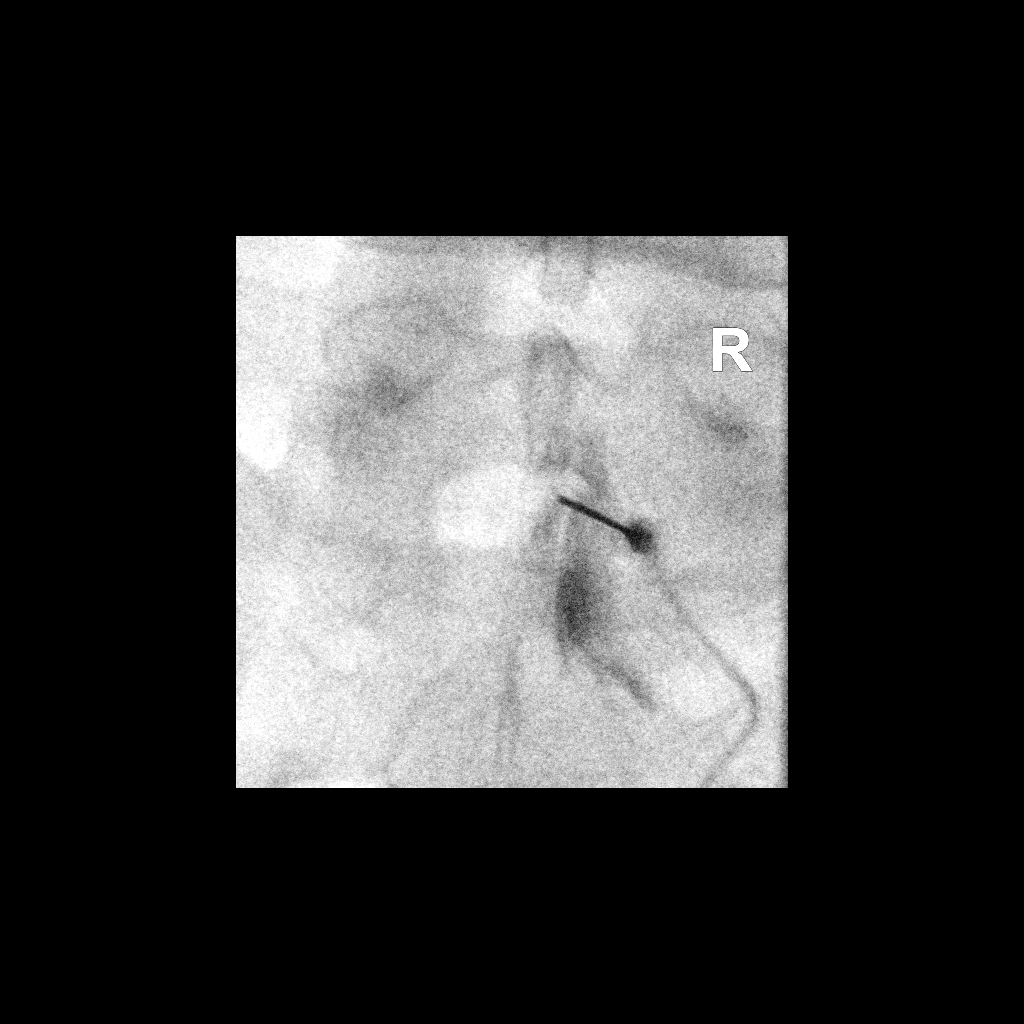

[2 of 2 positions shown; findings below may reference images not displayed]

FLUOROSCOPY TIME:  Fluoroscopy Time: 19 seconds

Radiation Exposure Index: 22.67 microGray*m^2

PROCEDURE:
The procedure, risks, benefits, and alternatives were explained to
the patient. Questions regarding the procedure were encouraged and
answered. The patient understands and consents to the procedure.

LUMBAR EPIDURAL INJECTION:

An interlaminar approach was performed on the right at L5-S1. The
overlying skin was cleansed and anesthetized. A 3.5 inch 20 gauge
epidural needle was advanced using loss-of-resistance technique.

DIAGNOSTIC EPIDURAL INJECTION:

Injection of Isovue-M 200 shows a good epidural pattern with spread
above and below the level of needle placement, primarily on the
right. No vascular opacification is seen.

THERAPEUTIC EPIDURAL INJECTION:

80 mg of Depo-Medrol mixed with 3 mL of 1% lidocaine were instilled.
The procedure was well-tolerated, and the patient was discharged
thirty minutes following the injection in good condition.

COMPLICATIONS:
None
IMPRESSION: Technically successful interlaminar epidural injection on the right
at L5-S1.

## 2023-08-13 ENCOUNTER — Encounter: Payer: Self-pay | Admitting: Internal Medicine

## 2023-08-13 ENCOUNTER — Ambulatory Visit: Payer: 59 | Admitting: Internal Medicine

## 2023-08-13 VITALS — BP 125/83 | HR 89 | Temp 97.9°F | Ht 70.0 in | Wt 249.4 lb

## 2023-08-13 DIAGNOSIS — R0989 Other specified symptoms and signs involving the circulatory and respiratory systems: Secondary | ICD-10-CM

## 2023-08-13 DIAGNOSIS — J0100 Acute maxillary sinusitis, unspecified: Secondary | ICD-10-CM | POA: Diagnosis not present

## 2023-08-13 DIAGNOSIS — R051 Acute cough: Secondary | ICD-10-CM

## 2023-08-13 DIAGNOSIS — R0981 Nasal congestion: Secondary | ICD-10-CM

## 2023-08-13 LAB — POC COVID19 BINAXNOW: SARS Coronavirus 2 Ag: NEGATIVE

## 2023-08-13 MED ORDER — PSEUDOEPHEDRINE HCL ER 120 MG PO TB12: 120.0000 mg | ORAL_TABLET | Freq: Two times a day (BID) | ORAL | 0 refills | Status: DC

## 2023-08-13 MED ORDER — PREDNISONE 20 MG PO TABS: ORAL_TABLET | ORAL | 0 refills | Status: DC

## 2023-08-13 MED ORDER — FLUTICASONE PROPIONATE 50 MCG/ACT NA SUSP: 2.0000 | Freq: Every day | NASAL | 6 refills | Status: DC

## 2023-08-13 NOTE — Patient Instructions (Signed)
VISIT SUMMARY:  During your visit, we discussed your severe sinus symptoms that started after your recent cruise vacation. You've been managing these symptoms with over-the-counter allergy medication and a home sinus rinse device, but they have persisted and significantly impacted your daily activities and work. You tested negative for COVID-19 and there are no signs of a bacterial infection, suggesting that allergies may be contributing to your symptoms.  YOUR PLAN:  -ACUTE SINUSITIS: Acute Sinusitis is a condition where your sinuses become swollen and inflamed, often due to an infection or allergies. We will continue your current allergy medication, start you on Flonase nasal spray, and Sudafed as needed for symptom relief. If your symptoms persist or worsen, we may consider a short course of Prednisone, a medication that can help reduce inflammation.  -GENERAL HEALTH MAINTENANCE: It's important to continue following COVID-19 precautions as per current guidelines to protect yourself and others from the virus.  INSTRUCTIONS:  We will provide a work excuse for 5 days due to your symptoms. Please continue to monitor your symptoms and contact us if they worsen or persist. Remember to take your medications as directed and continue using your home sinus rinse device.

## 2023-08-13 NOTE — Progress Notes (Signed)
Michael Collier: [phone]   -- Medical Office Visit --  Patient:  Michael Collier      Age: 42 y.o.       Sex:  male  Date:   08/13/2023 Patient Care Team: Ardith Dark, MD as PCP - General (Family Medicine) Ileana Ladd, MD (Inactive) as Consulting Physician (Family Medicine) Today's Healthcare Provider: [name], MD      Assessment & Plan Acute maxillary sinusitis, recurrence not specified Acute Sinusitis He exhibits severe sinus symptoms, but their COVID test is negative and there are no signs of bacterial infection upon examination, suggesting a possible allergy component. We will continue his current allergy medication, start Flonase nasal spray, and Sudafed as needed for symptom relief. A short course of Prednisone may be considered if symptoms persist or worsen. We will provide a work excuse for 5 days due to symptoms consistent with COVID although testing was negative in office. He just got back from cruise and had extensive potential exposure   General Health Maintenance He should continue to follow COVID-19 precautions as per current guidelines. Acute cough  Chest congestion  Head congestion  Diagnoses and all orders for this visit: Acute maxillary sinusitis, recurrence not specified -     fluticasone (FLONASE) 50 MCG/ACT nasal spray; Place 2 sprays into both nostrils daily. -     pseudoephedrine (SUDAFED 12 HOUR) 120 MG 12 hr tablet; Take 1 tablet (120 mg total) by mouth 2 (two) times daily. -     predniSONE (DELTASONE) 20 MG tablet; Take 2 pills for 3 days, 1 pill for 4 days Acute cough -     POC COVID-19 Chest congestion -     POC COVID-19 -     fluticasone (FLONASE) 50 MCG/ACT nasal spray; Place 2 sprays into both nostrils daily. -     pseudoephedrine (SUDAFED 12 HOUR) 120 MG 12 hr tablet; Take 1 tablet (120 mg total) by mouth 2 (two) times daily. -     predniSONE (DELTASONE) 20 MG tablet; Take 2 pills for 3 days, 1 pill for 4  days Head congestion -     POC COVID-19  Recommended follow-up: as needed  Future Appointments  Date Time Provider Department Center  08/31/2023  7:40 AM Ardith Dark, MD [facility]            Subjective   42 y.o. male who has Migraine; Degenerative disc disease, lumbar; GERD (gastroesophageal reflux disease); Dyslipidemia; Essential hypertension; Allergic rhinitis; and Hyperglycemia on their problem list. His reasons/main concerns/chief complaints for today's office visit are Headache, Nasal Congestion, Cough (Productive and dry at times, producing clear to yellowish mucus. All symptoms since Monday. Had a sore throat for a day but that resolved on it's own.), and Head/Chest congestion   ------------------------------------------------------------------------------------------------------------------------ AI-Extracted: Discussed the use of AI scribe software for clinical note transcription with the patient, who gave verbal consent to proceed.  History of Present Illness   The patient, a city Emergency planning/management officer, presented with symptoms that began on Monday evening, initially manifesting as a sore throat. The sore throat resolved by Tuesday, but was followed by severe sinus symptoms. The patient described experiencing coughing, sneezing, and significant head congestion. The worst of these symptoms occurred the night before the consultation. The patient denied any shortness of breath and tested negative for COVID-19.  The patient recently returned from a cruise vacation, which may have potentially exposed him to allergens or pathogens. Despite the severe sinus symptoms, there was  no visible pus or discharge in the nasal passages. The patient reported no current ear pain, although he experienced slight discomfort the previous day, possibly due to sinus pressure.  The patient has been managing his symptoms with over-the-counter allergy medication. He also has a home sinus rinse device, which he  has been using to alleviate his symptoms. Despite these measures, the patient's symptoms have persisted, significantly impacting his daily activities and work.      He has a past medical history of Asthma and Migraine.  Problem list overviews that were updated at today's visit: No problems updated. Current Outpatient Medications on File Prior to Visit  Medication Sig   cetirizine (ZYRTEC) 10 MG tablet Take 10 mg by mouth daily as needed for allergies.    IBUPROFEN PO Take by mouth as needed.   Omeprazole 20 MG TBDD Take by mouth.   No current facility-administered medications on file prior to visit.  There are no discontinued medications.   Objective   Physical Exam  BP 125/83 (BP Location: Left Arm, Patient Position: Sitting)   Pulse 89   Temp 97.9 F (36.6 C) (Temporal)   Ht 5\' 10"  (1.778 m)   Wt 249 lb 6.4 oz (113.1 kg)   SpO2 98%   BMI 35.79 kg/m  Wt Readings from Last 10 Encounters:  08/13/23 249 lb 6.4 oz (113.1 kg)  08/28/22 242 lb 3.2 oz (109.9 kg)  08/23/21 234 lb 4.8 oz (106.3 kg)  02/23/20 238 lb (108 kg)  05/12/18 231 lb (104.8 kg)  12/06/14 246 lb 8 oz (111.8 kg)  09/26/14 254 lb (115.2 kg)  09/07/14 250 lb (113.4 kg)  08/27/14 254 lb (115.2 kg)  08/24/14 256 lb (116.1 kg)   Vital signs reviewed.  Nursing notes reviewed. Weight trend reviewed. Abnormalities and Problem-Specific physical exam findings:    Physical Exam   HEENT: No pus or discharge observed in sinuses. Eardrum on one side shows previous injury, currently no concern. Throat without pus or swelling. CHEST: Lungs clear to auscultation. CARDIOVASCULAR: Heart examination reveals no abnormalities.    Excessive sniffling and sinus congestion. And throat clearing and coughing of thin secretions throughout appointment.    General Appearance:  No acute distress appreciable.   Well-groomed, healthy-appearing male.  Well proportioned with no abnormal fat distribution.  Good muscle tone. Pulmonary:   Normal work of breathing at rest, no respiratory distress apparent. SpO2: 98 %  Musculoskeletal: All extremities are intact.  Neurological:  Awake, alert, oriented, and engaged.  No obvious focal neurological deficits or cognitive impairments.  Sensorium seems unclouded.   Speech is clear and coherent with logical content. Psychiatric:  Appropriate mood, pleasant and cooperative demeanor, thoughtful and engaged during the exam  Results   Procedure: Nasal Endoscopy Description: The endoscope was inserted through the nasal passage. No purulent discharge observed. Sinuses appeared clear despite audible congestion.  LABS COVID-19 PCR: Negative (08/13/2023)        Results for orders placed or performed in visit on 08/13/23  POC COVID-19  Result Value Ref Range   SARS Coronavirus 2 Ag Negative Negative    Office Visit on 08/13/2023  Component Date Value   SARS Coronavirus 2 Ag 08/13/2023 Negative   Office Visit on 08/28/2022  Component Date Value   Hepatitis C Ab 08/28/2022 NON-REACTIVE    HIV 1&2 Ab, 4th Generati* 08/28/2022 NON-REACTIVE    WBC 08/28/2022 5.1    RBC 08/28/2022 4.94    Platelets 08/28/2022 178.0    Hemoglobin 08/28/2022  14.9    HCT 08/28/2022 42.7    MCV 08/28/2022 86.3    MCHC 08/28/2022 34.9    RDW 08/28/2022 13.2    Sodium 08/28/2022 140    Potassium 08/28/2022 4.2    Chloride 08/28/2022 105    CO2 08/28/2022 28    Glucose, Bld 08/28/2022 95    BUN 08/28/2022 16    Creatinine, Ser 08/28/2022 1.07    Total Bilirubin 08/28/2022 0.6    Alkaline Phosphatase 08/28/2022 74    AST 08/28/2022 20    ALT 08/28/2022 29    Total Protein 08/28/2022 7.1    Albumin 08/28/2022 4.6    GFR 08/28/2022 85.17    Calcium 08/28/2022 9.5    Hgb A1c MFr Bld 08/28/2022 5.2    TSH 08/28/2022 2.34    Cholesterol 08/28/2022 177    Triglycerides 08/28/2022 139.0    HDL 08/28/2022 40.20    VLDL 08/28/2022 27.8    LDL Cholesterol 08/28/2022 109 (H)    Total CHOL/HDL Ratio  08/28/2022 4    NonHDL 08/28/2022 136.34    No image results found.   No results found.  DG INJECT DIAG/THERA/INC NEEDLE/CATH/PLC EPI/LUMB/SAC W/IMG  Result Date: 03/07/2021 CLINICAL DATA:  Lumbosacral spondylosis without myelopathy. Pain across the low back. No radicular complaints. Disc degeneration at L4-5 and L5-S1. Right-sided epidural injection requested at L5-S1. FLUOROSCOPY TIME:  Fluoroscopy Time: 19 seconds Radiation Exposure Index: 22.67 microGray*m^2 PROCEDURE: The procedure, risks, benefits, and alternatives were explained to the patient. Questions regarding the procedure were encouraged and answered. The patient understands and consents to the procedure. LUMBAR EPIDURAL INJECTION: An interlaminar approach was performed on the right at L5-S1. The overlying skin was cleansed and anesthetized. A 3.5 inch 20 gauge epidural needle was advanced using loss-of-resistance technique. DIAGNOSTIC EPIDURAL INJECTION: Injection of Isovue-M 200 shows a good epidural pattern with spread above and below the level of needle placement, primarily on the right. No vascular opacification is seen. THERAPEUTIC EPIDURAL INJECTION: 80 mg of Depo-Medrol mixed with 3 mL of 1% lidocaine were instilled. The procedure was well-tolerated, and the patient was discharged thirty minutes following the injection in good condition. COMPLICATIONS: None IMPRESSION: Technically successful interlaminar epidural injection on the right at L5-S1. Electronically Signed   By: Sebastian Ache M.D.   On: 03/07/2021 08:51       Additional Info: This encounter employed real-time, collaborative documentation. The patient actively reviewed and updated their medical record on a shared screen, ensuring transparency and facilitating joint problem-solving for the problem list, overview, and plan. This approach promotes accurate, informed care. The treatment plan was discussed and reviewed in detail, including medication safety, potential side  effects, and all patient questions. We confirmed understanding and comfort with the plan. Follow-up instructions were established, including contacting the office for any concerns, returning if symptoms worsen, persist, or new symptoms develop, and precautions for potential emergency department visits.

## 2023-08-31 ENCOUNTER — Ambulatory Visit (INDEPENDENT_AMBULATORY_CARE_PROVIDER_SITE_OTHER): Payer: 59 | Admitting: Family Medicine

## 2023-08-31 ENCOUNTER — Encounter: Payer: Self-pay | Admitting: Family Medicine

## 2023-08-31 VITALS — BP 129/90 | HR 72 | Temp 98.0°F | Ht 70.0 in | Wt 252.4 lb

## 2023-08-31 DIAGNOSIS — E785 Hyperlipidemia, unspecified: Secondary | ICD-10-CM

## 2023-08-31 DIAGNOSIS — R5383 Other fatigue: Secondary | ICD-10-CM

## 2023-08-31 DIAGNOSIS — G4730 Sleep apnea, unspecified: Secondary | ICD-10-CM | POA: Insufficient documentation

## 2023-08-31 DIAGNOSIS — R739 Hyperglycemia, unspecified: Secondary | ICD-10-CM | POA: Diagnosis not present

## 2023-08-31 DIAGNOSIS — Z0001 Encounter for general adult medical examination with abnormal findings: Secondary | ICD-10-CM

## 2023-08-31 DIAGNOSIS — G4733 Obstructive sleep apnea (adult) (pediatric): Secondary | ICD-10-CM | POA: Insufficient documentation

## 2023-08-31 DIAGNOSIS — I10 Essential (primary) hypertension: Secondary | ICD-10-CM

## 2023-08-31 LAB — CBC
HCT: 42 % (ref 39.0–52.0)
Hemoglobin: 14.4 g/dL (ref 13.0–17.0)
MCHC: 34.2 g/dL (ref 30.0–36.0)
MCV: 87.2 fL (ref 78.0–100.0)
Platelets: 207 10*3/uL (ref 150.0–400.0)
RBC: 4.82 Mil/uL (ref 4.22–5.81)
RDW: 13.7 % (ref 11.5–15.5)
WBC: 5.8 10*3/uL (ref 4.0–10.5)

## 2023-08-31 LAB — COMPREHENSIVE METABOLIC PANEL
ALT: 30 U/L (ref 0–53)
AST: 16 U/L (ref 0–37)
Albumin: 4.5 g/dL (ref 3.5–5.2)
Alkaline Phosphatase: 86 U/L (ref 39–117)
BUN: 19 mg/dL (ref 6–23)
CO2: 24 meq/L (ref 19–32)
Calcium: 9.1 mg/dL (ref 8.4–10.5)
Chloride: 105 meq/L (ref 96–112)
Creatinine, Ser: 0.99 mg/dL (ref 0.40–1.50)
GFR: 92.84 mL/min (ref >60.00)
Glucose, Bld: 96 mg/dL (ref 70–99)
Potassium: 4.1 meq/L (ref 3.5–5.1)
Sodium: 138 meq/L (ref 135–145)
Total Bilirubin: 0.4 mg/dL (ref 0.2–1.2)
Total Protein: 6.7 g/dL (ref 6.0–8.3)

## 2023-08-31 LAB — HEMOGLOBIN A1C: Hgb A1c MFr Bld: 5.1 % (ref 4.6–6.5)

## 2023-08-31 LAB — LIPID PANEL
Cholesterol: 176 mg/dL (ref 0–200)
HDL: 30.8 mg/dL — ABNORMAL LOW (ref >39.00)
LDL Cholesterol: 69 mg/dL (ref 0–99)
NonHDL: 144.82
Total CHOL/HDL Ratio: 6
Triglycerides: 378 mg/dL — ABNORMAL HIGH (ref 0.0–149.0)
VLDL: 75.6 mg/dL — ABNORMAL HIGH (ref 0.0–40.0)

## 2023-08-31 LAB — TSH: TSH: 2.73 u[IU]/mL (ref 0.35–5.50)

## 2023-08-31 LAB — TESTOSTERONE: Testosterone: 129.36 ng/dL — ABNORMAL LOW (ref 300.00–890.00)

## 2023-08-31 NOTE — Patient Instructions (Addendum)
It was very nice to see you today!  We will check blood work today.  We will order a sleep study as well.  Please continue to work on diet and exercise.  Will see back in year for your next physical.  Come back sooner if needed.  Return in about 1 year (around 08/30/2024) for Annual Physical.   Take care, Dr [name]  PLEASE NOTE:  If you had any lab tests, please let us know if you have not heard back within a few days. You may see your results on mychart before we have a chance to review them but we will give you a call once they are reviewed by [place].   If we ordered any referrals today, please let us know if you have not heard from their office within the next week.   If you had any urgent prescriptions sent in today, please check with the pharmacy within an hour of our visit to make sure the prescription was transmitted appropriately.   Please try these tips to maintain a healthy lifestyle:  Eat at least 3 REAL meals and 1-2 snacks per day.  Aim for no more than 5 hours between eating.  If you eat breakfast, please do so within one hour of getting up.   Each meal should contain half fruits/vegetables, one quarter protein, and one quarter carbs (no bigger than a computer mouse)  Cut down on sweet beverages. This includes juice, soda, and sweet tea.   Drink at least 1 glass of water with each meal and aim for at least 8 glasses per day  Exercise at least 150 minutes every week.    Preventive Care 10-82 Years Old, Male Preventive care refers to lifestyle choices and visits with your health care provider that can promote health and wellness. Preventive care visits are also called wellness exams. What can I expect for my preventive care visit? Counseling During your preventive care visit, your health care provider may ask about your: Medical history, including: Past medical problems. Family medical history. Current health, including: Emotional well-being. Home life and  relationship well-being. Sexual activity. Lifestyle, including: Alcohol, nicotine or tobacco, and drug use. Access to firearms. Diet, exercise, and sleep habits. Safety issues such as seatbelt and bike helmet use. Sunscreen use. Work and work Astronomer. Physical exam Your health care provider will check your: Height and weight. These may be used to calculate your BMI (body mass index). BMI is a measurement that tells if you are at a healthy weight. Waist circumference. This measures the distance around your waistline. This measurement also tells if you are at a healthy weight and may help predict your risk of certain diseases, such as type 2 diabetes and high blood pressure. Heart rate and blood pressure. Body temperature. Skin for abnormal spots. What immunizations do I need?  Vaccines are usually given at various ages, according to a schedule. Your health care provider will recommend vaccines for you based on your age, medical history, and lifestyle or other factors, such as travel or where you work. What tests do I need? Screening Your health care provider may recommend screening tests for certain conditions. This may include: Lipid and cholesterol levels. Diabetes screening. This is done by checking your blood sugar (glucose) after you have not eaten for a while (fasting). Hepatitis B test. Hepatitis C test. HIV (human immunodeficiency virus) test. STI (sexually transmitted infection) testing, if you are at risk. Lung cancer screening. Prostate cancer screening. Colorectal cancer screening. Talk with  your health care provider about your test results, treatment options, and if necessary, the need for more tests. Follow these instructions at home: Eating and drinking  Eat a diet that includes fresh fruits and vegetables, whole grains, lean protein, and low-fat dairy products. Take vitamin and mineral supplements as recommended by your health care provider. Do not drink  alcohol if your health care provider tells you not to drink. If you drink alcohol: Limit how much you have to 0-2 drinks a day. Know how much alcohol is in your drink. In the [place] one drink equals one 12 oz bottle of beer (355 mL), one 5 oz glass of wine (148 mL), or one 1 oz glass of hard liquor (44 mL). Lifestyle Brush your teeth every morning and night with fluoride toothpaste. Floss one time each day. Exercise for at least 30 minutes 5 or more days each week. Do not use any products that contain nicotine or tobacco. These products include cigarettes, chewing tobacco, and vaping devices, such as e-cigarettes. If you need help quitting, ask your health care provider. Do not use drugs. If you are sexually active, practice safe sex. Use a condom or other form of protection to prevent STIs. Take aspirin only as told by your health care provider. Make sure that you understand how much to take and what form to take. Work with your health care provider to find out whether it is safe and beneficial for you to take aspirin daily. Find healthy ways to manage stress, such as: Meditation, yoga, or listening to music. Journaling. Talking to a trusted person. Spending time with friends and family. Minimize exposure to UV radiation to reduce your risk of skin cancer. Safety Always wear your seat belt while driving or riding in a vehicle. Do not drive: If you have been drinking alcohol. Do not ride with someone who has been drinking. When you are tired or distracted. While texting. If you have been using any mind-altering substances or drugs. Wear a helmet and other protective equipment during sports activities. If you have firearms in your house, make sure you follow all gun safety procedures. What's next? Go to your health care provider once a year for an annual wellness visit. Ask your health care provider how often you should have your eyes and teeth checked. Stay up to date on all  vaccines. This information is not intended to replace advice given to you by your health care provider. Make sure you discuss any questions you have with your health care provider. Document Revised: 04/24/2021 Document Reviewed: 04/24/2021 Elsevier Patient Education  2024 [employer].

## 2023-08-31 NOTE — Assessment & Plan Note (Signed)
Check lipids. Discussed lifestyle modifications.  

## 2023-08-31 NOTE — Assessment & Plan Note (Signed)
At goal.  Continue monitoring.  He will let us know if persistently elevated at home.

## 2023-08-31 NOTE — Progress Notes (Signed)
Chief Complaint:  Michael Collier is a 41 y.o. male who presents today for his annual comprehensive physical exam.    Assessment/Plan:  New/Acute Problems: Other Fatigue  Check labs.  May have underlying OSA.  Will refer for sleep study as well.  Chronic Problems Addressed Today: Dyslipidemia Check lipids.  Discussed lifestyle modifications.  Hyperglycemia Check A1c.  Essential hypertension At goal.  Continue monitoring.  He will let us know if persistently elevated at home.  Sleep-disordered breathing Wife has noted he snores frequently at night. We will order sleep study.   Preventative Healthcare: Check labs. Flu shot declined. Due for colon cancer screening next year.  Patient Counseling(The following topics were reviewed and/or handout was given):  -Nutrition: Stressed importance of moderation in sodium/caffeine intake, saturated fat and cholesterol, caloric balance, sufficient intake of fresh fruits, vegetables, and fiber.  -Stressed the importance of regular exercise.   -Substance Abuse: Discussed cessation/primary prevention of tobacco, alcohol, or other drug use; driving or other dangerous activities under the influence; availability of treatment for abuse.   -Injury prevention: Discussed safety belts, safety helmets, smoke detector, smoking near bedding or upholstery.   -Sexuality: Discussed sexually transmitted diseases, partner selection, use of condoms, avoidance of unintended pregnancy and contraceptive alternatives.   -Dental health: Discussed importance of regular tooth brushing, flossing, and dental visits.  -Health maintenance and immunizations reviewed. Please refer to Health maintenance section.  Return to care in 1 year for next preventative visit.     Subjective:  HPI:  He has no acute complaints today.   He has been experiencing more fatigue. He would like to have his testosterone checked. HE has noticed decreasing ability to build muscle mass. No  decreased libido or ED.  Lifestyle Diet: Balanced. Plenty of fruits and vegetables.  Exercise: Limited.      08/31/2023    7:41 AM  Depression screen PHQ 2/9  Decreased Interest 0  Down, Depressed, Hopeless 0  PHQ - 2 Score 0    There are no preventive care reminders to display for this patient.   ROS: Per HPI, otherwise a complete review of systems was negative.   PMH:  The following were reviewed and entered/updated in epic: Past Medical History:  Diagnosis Date   Asthma    Migraine    Patient Active Problem List   Diagnosis Date Noted   Sleep-disordered breathing 08/31/2023   Hyperglycemia 02/24/2020   GERD (gastroesophageal reflux disease) 02/23/2020   Dyslipidemia 02/23/2020   Essential hypertension 02/23/2020   Allergic rhinitis 02/23/2020   Degenerative disc disease, lumbar 05/12/2018   Migraine    Past Surgical History:  Procedure Laterality Date   None      Family History  Problem Relation Age of Onset   Hypertension Mother    Asthma Mother    Hypertension Father    Hyperlipidemia Father    Arthritis Father    Cancer Other     Medications- reviewed and updated Current Outpatient Medications  Medication Sig Dispense Refill   cetirizine (ZYRTEC) 10 MG tablet Take 10 mg by mouth daily as needed for allergies.      IBUPROFEN PO Take by mouth as needed.     Omeprazole 20 MG TBDD Take by mouth.     No current facility-administered medications for this visit.    Allergies-reviewed and updated No Known Allergies  Social History   Socioeconomic History   Marital status: Married    Spouse name: Michael Collier   Number of children: 2  Years of education: college   Highest education level: Not on file  Occupational History    Employer: [employer]  Tobacco Use   Smoking status: Former   Smokeless tobacco: Former    Types: Snuff    Quit date: 10/10/2013   Tobacco comments:    Quit 2014  Substance and Sexual Activity   Alcohol use: Yes     Alcohol/week: 1.0 standard drink of alcohol    Types: 1 Standard drinks or equivalent per week    Comment: Social    Drug use: No   Sexual activity: Not on file  Other Topics Concern   Not on file  Social History Narrative   Patient lives at home with his wife and children Michael Collier)   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   Education some college.   Left handed.   Caffeine tea three or four when he is at work. Un sweet tea.   Social Determinants of Health   Financial Resource Strain: Not on file  Food Insecurity: Not on file  Transportation Needs: Not on file  Physical Activity: Not on file  Stress: Not on file  Social Connections: Not on file        Objective:  Physical Exam: BP (!) 129/90   Pulse 72   Temp 98 F (36.7 C) (Temporal)   Ht 5\' 10"  (1.778 m)   Wt 252 lb 6.4 oz (114.5 kg)   SpO2 98%   BMI 36.22 kg/m   Body mass index is 36.22 kg/m. Wt Readings from Last 3 Encounters:  08/31/23 252 lb 6.4 oz (114.5 kg)  08/13/23 249 lb 6.4 oz (113.1 kg)  08/28/22 242 lb 3.2 oz (109.9 kg)   Gen: NAD, resting comfortably HEENT: TMs normal bilaterally. OP clear. No thyromegaly noted.  CV: RRR with no murmurs appreciated Pulm: NWOB, CTAB with no crackles, wheezes, or rhonchi GI: Normal bowel sounds present. Soft, Nontender, Nondistended. MSK: no edema, cyanosis, or clubbing noted Skin: warm, dry Neuro: CN2-12 grossly intact. Strength 5/5 in upper and lower extremities. Reflexes symmetric and intact bilaterally.  Psych: Normal affect and thought content     Michael Roy M. [name], MD 08/31/2023 8:13 AM

## 2023-08-31 NOTE — Assessment & Plan Note (Signed)
Wife has noted he snores frequently at night. We will order sleep study.

## 2023-08-31 NOTE — Assessment & Plan Note (Signed)
Check A1c. 

## 2023-09-03 NOTE — Progress Notes (Signed)
His testosterone is low.  This could be contributing some to his fatigue issues.  Recommend he come back for repeat testosterone level if he is interested in replacement.  The rest of his labs are all stable.  His triglycerides were a little bit elevated however this is treated with diet and exercise.  Do not need to start any medications.  He should continue to work on diet and exercise and we can recheck everything else in year.

## 2023-09-09 ENCOUNTER — Other Ambulatory Visit: Payer: Self-pay | Admitting: *Deleted

## 2023-09-09 DIAGNOSIS — R7989 Other specified abnormal findings of blood chemistry: Secondary | ICD-10-CM

## 2023-10-05 ENCOUNTER — Encounter: Payer: Self-pay | Admitting: Neurology

## 2023-10-05 ENCOUNTER — Ambulatory Visit (INDEPENDENT_AMBULATORY_CARE_PROVIDER_SITE_OTHER): Payer: 59 | Admitting: Neurology

## 2023-10-05 VITALS — BP 112/79 | HR 82 | Ht 70.0 in | Wt 256.0 lb

## 2023-10-05 DIAGNOSIS — Z9189 Other specified personal risk factors, not elsewhere classified: Secondary | ICD-10-CM

## 2023-10-05 DIAGNOSIS — R7989 Other specified abnormal findings of blood chemistry: Secondary | ICD-10-CM

## 2023-10-05 DIAGNOSIS — E669 Obesity, unspecified: Secondary | ICD-10-CM | POA: Diagnosis not present

## 2023-10-05 DIAGNOSIS — R0683 Snoring: Secondary | ICD-10-CM | POA: Diagnosis not present

## 2023-10-05 NOTE — Progress Notes (Signed)
Subjective:    Patient ID: Michael Collier is a 42 y.o. male.  HPI    Huston Foley, MD, PhD [facility] [address] [address] [place], [place] [address]  Dear Dr. [name],   I saw your patient, Michael Collier, upon your kind request in my sleep clinic today for initial consultation of his sleep disorder, in particular, concern for underlying obstructive sleep apnea.  The patient is unaccompanied today.  As you know, Mr. Gradney is a 42 year old male with an underlying medical history of low testosterone, hypertension, hyperlipidemia, hyperglycemia, history of headaches (seen by my colleague Dr. Terrace Arabia in the past), and obesity, who reports snoring. His Epworth sleepiness score is 6 out of 24, fatigue severity score is 14 out of 63.  He lives with his wife and 2 children.  He works full-time as a Emergency planning/management officer.  They have 2 dogs in the household, the dogs do not typically sleep in their bedroom.  He has a TV on in his bedroom at night and turns off on a sleep timer.  Bedtime is generally around 10 PM and rise time between 6 and 7 AM.  He does not have nightly nocturia and denies recurrent nocturnal or morning headaches.  He is not aware of any family history of sleep apnea.  He is occasionally restless at night but denies any restless leg symptoms.  He recently started on injections about 3 weeks ago.  He goes to [employer].  He does not have trouble falling asleep and no significant trouble staying asleep. I reviewed your office note from 08/31/2023.  He drinks caffeine in the form of coffee, usually 1 cup in the morning and normal tea during the day.  He quit smoking some 10 years ago and drinks alcohol about once a week.  His weight has been more or less stable.  His Past Medical History Is Significant For: Past Medical History:  Diagnosis Date   Asthma    Migraine     His Past Surgical History Is Significant For: Past Surgical History:  Procedure Laterality Date    None      His Family History Is Significant For: Family History  Problem Relation Age of Onset   Hypertension Mother    Asthma Mother    Hypertension Father    Hyperlipidemia Father    Arthritis Father    Cancer Other     His Social History Is Significant For: Social History   Socioeconomic History   Marital status: Married    Spouse name: Michael Collier   Number of children: 2   Years of education: college   Highest education level: Not on file  Occupational History    Employer: [employer]  Tobacco Use   Smoking status: Former   Smokeless tobacco: Former    Types: Snuff    Quit date: 10/10/2013   Tobacco comments:    Quit 2014  Substance and Sexual Activity   Alcohol use: Yes    Alcohol/week: 1.0 standard drink of alcohol    Types: 1 Standard drinks or equivalent per week    Comment: Social    Drug use: No   Sexual activity: Not on file  Other Topics Concern   Not on file  Social History Narrative   Patient lives at home with his wife and children Michael Collier)   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   Education some college.   Left handed.   Caffeine  tea three or four when he is at work. Un sweet tea.   Social Determinants of Health   Financial Resource Strain: Not on file  Food Insecurity: Not on file  Transportation Needs: Not on file  Physical Activity: Not on file  Stress: Not on file  Social Connections: Not on file    His Allergies Are:  No Known Allergies:   His Current Medications Are:  Outpatient Encounter Medications as of 10/05/2023  Medication Sig   Ascorbic Acid (VITAMIN C) 100 MG tablet Take 100 mg by mouth daily.   cetirizine (ZYRTEC) 10 MG tablet Take 10 mg by mouth daily as needed for allergies.    Omeprazole 20 MG TBDD Take by mouth.   IBUPROFEN PO Take by mouth as needed. (Patient not taking: Reported on 10/05/2023)   No facility-administered encounter medications on file as of 10/05/2023.   :   Review of Systems:  Out of a complete 14 point review of systems, all are reviewed and negative with the exception of these symptoms as listed below:  Review of Systems  Neurological:        Pt is here for Sleep Consult. No previous S.S. states he does snores.Denies HTN, denies headaches.  ESS 6 FSS 14    Objective:  Neurological Exam  Physical Exam Physical Examination:   Vitals:   10/05/23 1529  BP: 112/79  Pulse: 82    General Examination: The patient is a very pleasant 42 y.o. male in no acute distress. He appears well-developed and well-nourished and well groomed.   HEENT: Normocephalic, atraumatic, pupils are equal, round and reactive to light, extraocular tracking is good without limitation to gaze excursion or nystagmus noted. Hearing is grossly intact. Face is symmetric with normal facial animation. Speech is clear with no dysarthria noted. There is no hypophonia. There is no lip, neck/head, jaw or voice tremor. Neck is supple with full range of passive and active motion. There are no carotid bruits on auscultation. Oropharynx exam reveals: mild mouth dryness, adequate dental hygiene and moderate airway crowding, due to small airway entry and wider, slightly prominent uvula, Mallampati class III.  Tongue protrudes centrally and palate elevates symmetrically, neck circumference 18-1/4 inches, minimal overbite noted.  Tongue protrudes centrally and palate elevates symmetrically.  Chest: Clear to auscultation without wheezing, rhonchi or crackles noted.  Heart: S1+S2+0, regular and normal without murmurs, rubs or gallops noted.   Abdomen: Soft, non-tender and non-distended.  Extremities: There is no obvious swelling in the distal lower extremities bilaterally.   Skin: Warm and dry without trophic changes noted.   Musculoskeletal: exam reveals no obvious joint deformities.   Neurologically:  Mental status: The patient is awake, alert and oriented in all 4 spheres.  His immediate and remote memory, attention, language skills and fund of knowledge are appropriate. There is no evidence of aphasia, agnosia, apraxia or anomia. Speech is clear with normal prosody and enunciation. Thought process is linear. Mood is normal and affect is normal.  Cranial nerves II - XII are as described above under HEENT exam.  Motor exam: Normal bulk, strength and tone is noted. There is no obvious action or resting tremor.  Fine motor skills and coordination: grossly intact.  Cerebellar testing: No dysmetria or intention tremor. There is no truncal or gait ataxia.  Sensory exam: intact to light touch in the upper and lower extremities.  Gait, station and balance: He stands easily. No veering to one side is noted. No leaning to one side is  noted. Posture is age-appropriate and stance is narrow based. Gait shows normal stride length and normal pace. No problems turning are noted.   Assessment and plan:  In summary, Aodhan Mccalley is a very pleasant 42 y.o.-year old male with an underlying medical history of low testosterone, hypertension, hyperlipidemia, hyperglycemia, history of headaches (seen by my colleague Dr. Terrace Arabia in the past), and obesity, whose history and physical exam are concerning for sleep disordered breathing, particularly obstructive sleep apnea (OSA).  While a laboratory attended sleep study is typically considered "gold standard" for evaluation of sleep disordered breathing, we mutually agreed to proceed with a home sleep test at this time.   I had a long chat with the patient about my findings and the diagnosis of sleep apnea, particularly OSA, its prognosis and treatment options. We talked about medical/conservative treatments, surgical interventions and non-pharmacological approaches for symptom control. I explained, in particular, the risks and ramifications of untreated moderate to severe OSA, especially with respect to developing cardiovascular disease down the road,  including congestive heart failure (CHF), difficult to treat hypertension, cardiac arrhythmias (particularly A-fib), neurovascular complications including TIA, stroke and dementia. Even type 2 diabetes has, in part, been linked to untreated OSA. Symptoms of untreated OSA may include (but may not be limited to) daytime sleepiness, nocturia (i.e. frequent nighttime urination), memory problems, mood irritability and suboptimally controlled or worsening mood disorder such as depression and/or anxiety, lack of energy, lack of motivation, physical discomfort, as well as recurrent headaches, especially morning or nocturnal headaches. We talked about the importance of maintaining a healthy lifestyle and striving for healthy weight. In addition, we talked about the importance of striving for and maintaining good sleep hygiene. I recommended a sleep study at this time. I outlined the differences between a laboratory attended sleep study which is considered more comprehensive and accurate over the option of a home sleep test (HST); the latter may lead to underestimation of sleep disordered breathing in some instances and does not help with diagnosing upper airway resistance syndrome and is not accurate enough to diagnose primary central sleep apnea typically. I outlined possible surgical and non-surgical treatment options of OSA, including the use of a positive airway pressure (PAP) device (i.e. CPAP, AutoPAP/APAP or BiPAP in certain circumstances), a custom-made dental device (aka oral appliance, which would require a referral to a specialist dentist or orthodontist typically, and is generally speaking not considered for patients with full dentures or edentulous state), upper airway surgical options, such as traditional UPPP (which is not considered a first-line treatment) or the Inspire device (hypoglossal nerve stimulator, which would involve a referral for consultation with an ENT surgeon, after careful selection,  following inclusion criteria - also not first-line treatment). I explained the PAP treatment option to the patient in detail, as this is generally considered first-line treatment.  The patient indicated that he would be willing to try PAP therapy, if the need arises. I explained the importance of being compliant with PAP treatment, not only for insurance purposes but primarily to improve patient's symptoms symptoms, and for the patient's long term health benefit, including to reduce His cardiovascular risks longer-term.    We will pick up our discussion about the next steps and treatment options after testing.  We will keep him posted as to the test results by phone call and/or MyChart messaging where possible.  We will plan to follow-up in sleep clinic accordingly as well.  I answered all his questions today and the patient was in agreement.  I encouraged him to call with any interim questions, concerns, problems or updates or email [place] through MyChart.  Generally speaking, sleep test authorizations may take up to 2 weeks, sometimes less, sometimes longer, the patient is encouraged to get in touch with [place] if they do not hear back from the sleep lab staff directly within the next 2 weeks.  Thank you very much for allowing me to participate in the care of this nice patient. If I can be of any further assistance to you please do not hesitate to call me at [phone].  Sincerely,   Huston Foley, MD, PhD

## 2023-10-05 NOTE — Patient Instructions (Signed)

## 2023-10-14 ENCOUNTER — Ambulatory Visit: Payer: 59 | Admitting: Neurology

## 2023-10-14 DIAGNOSIS — E669 Obesity, unspecified: Secondary | ICD-10-CM

## 2023-10-14 DIAGNOSIS — G4733 Obstructive sleep apnea (adult) (pediatric): Secondary | ICD-10-CM

## 2023-10-14 DIAGNOSIS — G4734 Idiopathic sleep related nonobstructive alveolar hypoventilation: Secondary | ICD-10-CM

## 2023-10-14 DIAGNOSIS — R0683 Snoring: Secondary | ICD-10-CM

## 2023-10-14 DIAGNOSIS — Z9189 Other specified personal risk factors, not elsewhere classified: Secondary | ICD-10-CM

## 2023-10-14 DIAGNOSIS — R7989 Other specified abnormal findings of blood chemistry: Secondary | ICD-10-CM

## 2023-11-17 ENCOUNTER — Telehealth: Payer: Self-pay | Admitting: *Deleted

## 2023-11-17 NOTE — Telephone Encounter (Signed)
 I called pt with results of sleep study below.  [facility].  He will use for 4 hours every night  Insurance compliance appt made 02-04-2024 at 0815 VV. With MM/NP.  I answered questions.  He is to call back in a week if not heard from them.  He verbalized understanding.   Community message sent to [facility], with confirmation.     11/12/23  1:44 PM Result Note Urgent set up requested on PAP therapy, due to severe OSA. Patient referred by PCP, seen by me on 10/05/23, patient had a HST on 11/06/23.   Please call and notify the patient that the recent home sleep test showed obstructive sleep apnea in the severe range. I recommend treatment for this in the form of autoPAP, which means, that we don't have to bring him in for a sleep study with CPAP, but will let him start using a so called autoPAP machine at home, through a DME company (of his choice, or as per insurance requirement). The DME representative will fit the patient with a mask of choice, educate him on how to use the machine, how to put the mask on, etc. I have placed an order in the chart. Please send the order to a local DME, talk to patient, send report to referring MD. Please also reinforce the need for compliance with treatment. We will need a FU in sleep clinic for 10 weeks post-PAP set up, please arrange that with me or one of our NPs. Thanks, True Mar, MD, PhD [facility])

## 2023-11-17 NOTE — Telephone Encounter (Signed)
 New, Adine Neysa Nena GORMAN, RN; New, Bradley; Cain, Mitchell; Garcia, Patricia; Ziegler, Melissa Received, thank you!     Previous Messages    ----- Message ----- From: Neysa Nena GORMAN, RN Sent: 11/17/2023  10:59 AM EST To: Adine Randolm Avelina Jackson; Eleanor Pulling; * Subject: urgent set up, severe OSA                      Good morning  New order in epic, severe osa, urgent set up.  Michael Collier Male, 43 y.o., March 06, 1981 MRN: [id number] Phone: [phone]   Thanks  [name]

## 2024-02-03 ENCOUNTER — Telehealth: Payer: Self-pay

## 2024-02-03 NOTE — Telephone Encounter (Signed)
 Please read mychart message from today 02/03/24.

## 2024-02-03 NOTE — Progress Notes (Unsigned)
 Michael Collier

## 2024-02-04 ENCOUNTER — Telehealth (INDEPENDENT_AMBULATORY_CARE_PROVIDER_SITE_OTHER): Payer: 59 | Admitting: Adult Health

## 2024-02-04 DIAGNOSIS — G4733 Obstructive sleep apnea (adult) (pediatric): Secondary | ICD-10-CM | POA: Diagnosis not present

## 2024-02-04 NOTE — Progress Notes (Signed)
 PATIENT: Michael Collier DOB: 1981/01/01  REASON FOR VISIT: follow up HISTORY FROM: patient PRIMARY NEUROLOGIST: Dr. Frances Furbish   Virtual Visit via Video Note  I connected with Michael Collier on 02/04/24 at  8:15 AM EDT by a video enabled telemedicine application located remotely at [facility] and verified that I am speaking with the correct person using two identifiers who was located at their own home.   I discussed the limitations of evaluation and management by telemedicine and the availability of in person appointments. The patient expressed understanding and agreed to proceed.   PATIENT: Michael Collier DOB: 03-23-1981  REASON FOR VISIT: follow up HISTORY FROM: patient  HISTORY OF PRESENT ILLNESS: Today 02/04/24:  Albi Rappaport is a 43 y.o. male with a history of obstructive sleep apnea on CPAP. Returns today for follow-up.  This is his initial CPAP follow-up.  He reports that the CPAP is working well.  He can tell a benefit since he started using it.  He does feel the mask leaking but reports that it does not bother him.  He actually reports that it helps him sleep better.     HISTORY   REVIEW OF SYSTEMS: Out of a complete 14 system review of symptoms, the patient complains only of the following symptoms, and all other reviewed systems are negative.  ALLERGIES: No Known Allergies  HOME MEDICATIONS: Outpatient Medications Prior to Visit  Medication Sig Dispense Refill   Ascorbic Acid (VITAMIN C) 100 MG tablet Take 100 mg by mouth daily.     cetirizine (ZYRTEC) 10 MG tablet Take 10 mg by mouth daily as needed for allergies.      IBUPROFEN PO Take by mouth as needed. (Patient not taking: Reported on 10/05/2023)     Omeprazole 20 MG TBDD Take by mouth.     No facility-administered medications prior to visit.    PAST MEDICAL HISTORY: Past Medical History:  Diagnosis Date   Asthma    Migraine     PAST SURGICAL HISTORY: Past Surgical History:   Procedure Laterality Date   None      FAMILY HISTORY: Family History  Problem Relation Age of Onset   Hypertension Mother    Asthma Mother    Hypertension Father    Hyperlipidemia Father    Arthritis Father    Cancer Other     SOCIAL HISTORY: Social History   Socioeconomic History   Marital status: Married    Spouse name: Michael Collier   Number of children: 2   Years of education: college   Highest education level: Not on file  Occupational History    Employer: [employer]  Tobacco Use   Smoking status: Former   Smokeless tobacco: Former    Types: Snuff    Quit date: 10/10/2013   Tobacco comments:    Quit 2014  Substance and Sexual Activity   Alcohol use: Yes    Alcohol/week: 1.0 standard drink of alcohol    Types: 1 Standard drinks or equivalent per week    Comment: Social    Drug use: No   Sexual activity: Not on file  Other Topics Concern   Not on file  Social History Narrative   Patient lives at home with his wife and children Michael Collier)   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   Education some college.   Left handed.   Caffeine tea three or four when he is at work. Un sweet tea.  Social Drivers of Corporate investment banker Strain: Not on file  Food Insecurity: Not on file  Transportation Needs: Not on file  Physical Activity: Not on file  Stress: Not on file  Social Connections: Not on file  Intimate Partner Violence: Not on file      PHYSICAL EXAM Generalized: Well developed, in no acute distress   Neurological examination  Mentation: Alert oriented to time, place, history taking. Follows all commands speech and language fluent Cranial nerve II-XII:Extraocular movements were full. Facial symmetry noted. uvula tongue midline. Head turning and shoulder shrug  were normal and symmetric. Motor: Good strength throughout subjectively per patient Sensory: Sensory testing is intact to soft touch on all 4 extremities  subjectively per patient Coordination: Cerebellar testing reveals good finger-nose-finger  Gait and station: Patient is able to stand from a seated position. gait is normal.  Reflexes: UTA  DIAGNOSTIC DATA (LABS, IMAGING, TESTING) - I reviewed patient records, labs, notes, testing and imaging myself where available.  Lab Results  Component Value Date   WBC 5.8 08/31/2023   HGB 14.4 08/31/2023   HCT 42.0 08/31/2023   MCV 87.2 08/31/2023   PLT 207.0 08/31/2023      Component Value Date/Time   NA 138 08/31/2023 0816   K 4.1 08/31/2023 0816   CL 105 08/31/2023 0816   CO2 24 08/31/2023 0816   GLUCOSE 96 08/31/2023 0816   BUN 19 08/31/2023 0816   CREATININE 0.99 08/31/2023 0816   CREATININE 1.13 05/11/2014 0940   CALCIUM 9.1 08/31/2023 0816   PROT 6.7 08/31/2023 0816   ALBUMIN 4.5 08/31/2023 0816   AST 16 08/31/2023 0816   ALT 30 08/31/2023 0816   ALKPHOS 86 08/31/2023 0816   BILITOT 0.4 08/31/2023 0816   GFRNONAA >90 08/22/2014 1210   GFRAA >90 08/22/2014 1210   Lab Results  Component Value Date   CHOL 176 08/31/2023   HDL 30.80 (L) 08/31/2023   LDLCALC 69 08/31/2023   TRIG 378.0 (H) 08/31/2023   CHOLHDL 6 08/31/2023   Lab Results  Component Value Date   HGBA1C 5.1 08/31/2023   No results found for: "VITAMINB12" Lab Results  Component Value Date   TSH 2.73 08/31/2023      ASSESSMENT AND PLAN 43 y.o. year old male  has a past medical history of Asthma and Migraine. here with:  OSA on CPAP  CPAP compliance excellent Residual AHI is good Encouraged patient to continue using CPAP nightly and > 4 hours each night F/U in 1 year or sooner if needed  .  Michael Penny, MSN, NP-C 02/04/2024, 8:09 AM [facility] [address] [place], [place] [address] [phone]

## 2024-07-12 ENCOUNTER — Encounter: Payer: Self-pay | Admitting: Sports Medicine

## 2024-08-12 ENCOUNTER — Telehealth: Admitting: Family Medicine

## 2024-08-12 DIAGNOSIS — J019 Acute sinusitis, unspecified: Secondary | ICD-10-CM

## 2024-08-12 DIAGNOSIS — B9689 Other specified bacterial agents as the cause of diseases classified elsewhere: Secondary | ICD-10-CM

## 2024-08-12 MED ORDER — PROMETHAZINE-DM 6.25-15 MG/5ML PO SYRP: 5.0000 mL | ORAL_SOLUTION | Freq: Four times a day (QID) | ORAL | 0 refills | Status: AC | PRN

## 2024-08-12 MED ORDER — AMOXICILLIN-POT CLAVULANATE 875-125 MG PO TABS: 1.0000 | ORAL_TABLET | Freq: Two times a day (BID) | ORAL | 0 refills | Status: DC

## 2024-08-12 NOTE — Progress Notes (Signed)
 Virtual Visit Consent   Michael Collier, you are scheduled for a virtual visit with a [facility] provider today. Just as with appointments in the office, your consent must be obtained to participate. Your consent will be active for this visit and any virtual visit you may have with one of our providers in the next 365 days. If you have a MyChart account, a copy of this consent can be sent to you electronically.  As this is a virtual visit, video technology does not allow for your provider to perform a traditional examination. This may limit your provider's ability to fully assess your condition. If your provider identifies any concerns that need to be evaluated in person or the need to arrange testing (such as labs, EKG, etc.), we will make arrangements to do so. Although advances in technology are sophisticated, we cannot ensure that it will always work on either your end or our end. If the connection with a video visit is poor, the visit may have to be switched to a telephone visit. With either a video or telephone visit, we are not always able to ensure that we have a secure connection.  By engaging in this virtual visit, you consent to the provision of healthcare and authorize for your insurance to be billed (if applicable) for the services provided during this visit. Depending on your insurance coverage, you may receive a charge related to this service.  I need to obtain your verbal consent now. Are you willing to proceed with your visit today? Michael Collier has provided verbal consent on 08/12/2024 for a virtual visit (video or telephone). [name], FNP  Date: 08/12/2024 9:47 AM   Virtual Visit via Video Note   I, [name], connected with  Michael Collier  ([id number], 1981/03/18) on 08/12/24 at  9:15 AM EDT by a video-enabled telemedicine application and verified that I am speaking with the correct person using two identifiers.  Location: Patient: Virtual Visit Location Patient:  Home Provider: Virtual Visit Location Provider: Home Office   I discussed the limitations of evaluation and management by telemedicine and the availability of in person appointments. The patient expressed understanding and agreed to proceed.    History of Present Illness: Michael Collier is a 43 y.o. who identifies as a male who was assigned male at birth, and is being seen today for sinus pressure and pain with post nasal drainage, cough, sx for 5 days worsening. Possible fever today. No covid testing. [name]  HPI: HPI  Problems:  Patient Active Problem List   Diagnosis Date Noted   Sleep-disordered breathing 08/31/2023   Hyperglycemia 02/24/2020   GERD (gastroesophageal reflux disease) 02/23/2020   Dyslipidemia 02/23/2020   Essential hypertension 02/23/2020   Allergic rhinitis 02/23/2020   Degenerative disc disease, lumbar 05/12/2018   Migraine     Allergies: No Known Allergies Medications:  Current Outpatient Medications:    amoxicillin-clavulanate (AUGMENTIN) 875-125 MG tablet, Take 1 tablet by mouth 2 (two) times daily., Disp: 20 tablet, Rfl: 0   promethazine-dextromethorphan (PROMETHAZINE-DM) 6.25-15 MG/5ML syrup, Take 5 mLs by mouth 4 (four) times daily as needed for up to 10 days for cough., Disp: 118 mL, Rfl: 0   Ascorbic Acid (VITAMIN C) 100 MG tablet, Take 100 mg by mouth daily., Disp: , Rfl:    cetirizine (ZYRTEC) 10 MG tablet, Take 10 mg by mouth daily as needed for allergies. , Disp: , Rfl:    IBUPROFEN PO, Take by mouth as needed. (Patient not taking: Reported on 10/05/2023),  Disp: , Rfl:    Omeprazole 20 MG TBDD, Take by mouth., Disp: , Rfl:   Observations/Objective: Patient is well-developed, well-nourished in no acute distress.  Resting comfortably  at home.  Head is normocephalic, atraumatic.  No labored breathing.  Speech is clear and coherent with logical content.  Patient is alert and oriented at baseline.    Assessment and Plan: 1. Acute bacterial sinusitis  (Primary)  Increase fluids humidifier at night, tylenol  or ibuprofen as directed, UC as needed. May use allegra and flonase .   Follow Up Instructions: I discussed the assessment and treatment plan with the patient. The patient was provided an opportunity to ask questions and all were answered. The patient agreed with the plan and demonstrated an understanding of the instructions.  A copy of instructions were sent to the patient via MyChart unless otherwise noted below.     The patient was advised to call back or seek an in-person evaluation if the symptoms worsen or if the condition fails to improve as anticipated.    [name], FNP

## 2024-08-12 NOTE — Patient Instructions (Signed)

## 2024-08-31 ENCOUNTER — Ambulatory Visit: Payer: 59 | Admitting: Family Medicine

## 2024-08-31 ENCOUNTER — Encounter: Payer: Self-pay | Admitting: Family Medicine

## 2024-08-31 VITALS — BP 112/80 | HR 92 | Temp 98.2°F | Ht 70.0 in | Wt 249.0 lb

## 2024-08-31 DIAGNOSIS — Z0000 Encounter for general adult medical examination without abnormal findings: Secondary | ICD-10-CM

## 2024-08-31 DIAGNOSIS — G4733 Obstructive sleep apnea (adult) (pediatric): Secondary | ICD-10-CM

## 2024-08-31 DIAGNOSIS — E785 Hyperlipidemia, unspecified: Secondary | ICD-10-CM

## 2024-08-31 DIAGNOSIS — Z0001 Encounter for general adult medical examination with abnormal findings: Secondary | ICD-10-CM

## 2024-08-31 DIAGNOSIS — R7989 Other specified abnormal findings of blood chemistry: Secondary | ICD-10-CM | POA: Diagnosis not present

## 2024-08-31 DIAGNOSIS — R739 Hyperglycemia, unspecified: Secondary | ICD-10-CM

## 2024-08-31 DIAGNOSIS — I10 Essential (primary) hypertension: Secondary | ICD-10-CM | POA: Diagnosis not present

## 2024-08-31 DIAGNOSIS — Z1211 Encounter for screening for malignant neoplasm of colon: Secondary | ICD-10-CM

## 2024-08-31 LAB — COMPREHENSIVE METABOLIC PANEL WITH GFR
ALT: 38 U/L (ref 0–53)
AST: 25 U/L (ref 0–37)
Albumin: 4.7 g/dL (ref 3.5–5.2)
Alkaline Phosphatase: 61 U/L (ref 39–117)
BUN: 20 mg/dL (ref 6–23)
CO2: 26 meq/L (ref 19–32)
Calcium: 9.3 mg/dL (ref 8.4–10.5)
Chloride: 102 meq/L (ref 96–112)
Creatinine, Ser: 1.17 mg/dL (ref 0.40–1.50)
GFR: 75.44 mL/min (ref >60.00)
Glucose, Bld: 88 mg/dL (ref 70–99)
Potassium: 4.3 meq/L (ref 3.5–5.1)
Sodium: 138 meq/L (ref 135–145)
Total Bilirubin: 0.6 mg/dL (ref 0.2–1.2)
Total Protein: 6.9 g/dL (ref 6.0–8.3)

## 2024-08-31 LAB — LIPID PANEL
Cholesterol: 183 mg/dL (ref 0–200)
HDL: 28 mg/dL — ABNORMAL LOW (ref >39.00)
LDL Cholesterol: 84 mg/dL (ref 0–99)
NonHDL: 155.41
Total CHOL/HDL Ratio: 7
Triglycerides: 357 mg/dL — ABNORMAL HIGH (ref 0.0–149.0)
VLDL: 71.4 mg/dL — ABNORMAL HIGH (ref 0.0–40.0)

## 2024-08-31 LAB — HEMOGLOBIN A1C: Hgb A1c MFr Bld: 5.2 % (ref 4.6–6.5)

## 2024-08-31 LAB — CBC
HCT: 51.5 % (ref 39.0–52.0)
Hemoglobin: 17.6 g/dL — ABNORMAL HIGH (ref 13.0–17.0)
MCHC: 34.1 g/dL (ref 30.0–36.0)
MCV: 88.9 fl (ref 78.0–100.0)
Platelets: 198 K/uL (ref 150.0–400.0)
RBC: 5.79 Mil/uL (ref 4.22–5.81)
RDW: 13.2 % (ref 11.5–15.5)
WBC: 4.8 K/uL (ref 4.0–10.5)

## 2024-08-31 LAB — TESTOSTERONE: Testosterone: 691.25 ng/dL (ref 300.00–890.00)

## 2024-08-31 LAB — PSA: PSA: 0.6 ng/mL (ref 0.10–4.00)

## 2024-08-31 LAB — TSH: TSH: 1.78 u[IU]/mL (ref 0.35–5.50)

## 2024-08-31 NOTE — Progress Notes (Signed)
 Chief Complaint:  Michael Collier is a 43 y.o. male who presents today for his annual comprehensive physical exam.    Assessment/Plan:  Chronic Problems Addressed Today: Hyperglycemia Check A1c with labs.   Dyslipidemia Check lipids.  Essential hypertension At goal today.  He has had significant improvement in blood pressure readings since starting CPAP.  He will monitor at home and let us  know if persistently elevated.  OSA on CPAP Doing well with CPAP.  Has had significant improvement in sleep quality.  Low testosterone  He is now following with [facility] endocrinology.  Will check labs today.  He is on testosterone  replacement.   Preventative Healthcare: Check labs.  Vaccines declined.  Will refer for colonoscopy.  Patient Counseling(The following topics were reviewed and/or handout was given):  -Nutrition: Stressed importance of moderation in sodium/caffeine intake, saturated fat and cholesterol, caloric balance, sufficient intake of fresh fruits, vegetables, and fiber.  -Stressed the importance of regular exercise.   -Substance Abuse: Discussed cessation/primary prevention of tobacco, alcohol, or other drug use; driving or other dangerous activities under the influence; availability of treatment for abuse.   -Injury prevention: Discussed safety belts, safety helmets, smoke detector, smoking near bedding or upholstery.   -Sexuality: Discussed sexually transmitted diseases, partner selection, use of condoms, avoidance of unintended pregnancy and contraceptive alternatives.   -Dental health: Discussed importance of regular tooth brushing, flossing, and dental visits.  -Health maintenance and immunizations reviewed. Please refer to Health maintenance section.  Return to care in 1 year for next preventative visit.     Subjective:  HPI:  He has no acute complaints today. Patient is here today for his annual physical.  See assessment / plan for status of chronic  conditions.  Discussed the use of AI scribe software for clinical note transcription with the patient, who gave verbal consent to proceed.  History of Present Illness Michael Collier is a 43 year old male who presents for an annual physical exam.  He recently experienced a sinus infection that has since resolved. He has been using a CPAP machine nightly, which has significantly improved his sleep quality. Initially, he found the first month of CPAP use challenging but now notes a substantial improvement in sleep quality since adjusting to the device.  He started testosterone  therapy after low levels were identified. He receives treatment at [facility], where his levels are monitored every three months. He feels a significant improvement since starting the supplement.  He is focusing on improving his diet and exercise routine. He goes to the gym about five days a week, engaging in weight training, walking, and using the elliptical. He also plays a lot of golf. He is trying to include plenty of fruits and vegetables in his diet, although it is challenging with his children's sports activities.  He recently returned from a trip to [place], where he attended a football game and spent a week exploring the area.     Lifestyle Diet: Balanced. Plenty of fruits and vegetables.  Exercise: Going to gym daily. Weight training      08/31/2024    7:49 AM  Depression screen PHQ 2/9  Decreased Interest 0  Down, Depressed, Hopeless 0  PHQ - 2 Score 0    Health Maintenance Due  Topic Date Due   Colonoscopy  Never done     ROS: Per HPI, otherwise a complete review of systems was negative.   PMH:  The following were reviewed and entered/updated in epic: Past Medical History:  Diagnosis Date   Asthma    Migraine    Patient Active Problem List   Diagnosis Date Noted   Low testosterone  08/31/2024   OSA on CPAP 08/31/2023   Hyperglycemia 02/24/2020   GERD (gastroesophageal reflux  disease) 02/23/2020   Dyslipidemia 02/23/2020   Essential hypertension 02/23/2020   Allergic rhinitis 02/23/2020   Degenerative disc disease, lumbar 05/12/2018   Migraine    Past Surgical History:  Procedure Laterality Date   None      Family History  Problem Relation Age of Onset   Hypertension Mother    Asthma Mother    Hypertension Father    Hyperlipidemia Father    Arthritis Father    Cancer Other     Medications- reviewed and updated Current Outpatient Medications  Medication Sig Dispense Refill   anastrozole (ARIMIDEX) 1 MG tablet Take 1 mg by mouth once a week.     Ascorbic Acid (VITAMIN C) 100 MG tablet Take 100 mg by mouth daily.     cetirizine (ZYRTEC) 10 MG tablet Take 10 mg by mouth daily as needed for allergies.      IBUPROFEN PO Take by mouth as needed.     Omeprazole 20 MG TBDD Take by mouth.     testosterone  cypionate (DEPOTESTOSTERONE CYPIONATE) 200 MG/ML injection Inject 200 mg into the muscle once a week.     No current facility-administered medications for this visit.    Allergies-reviewed and updated No Known Allergies  Social History   Socioeconomic History   Marital status: Married    Spouse name: [name]   Number of children: 2   Years of education: college   Highest education level: Not on file  Occupational History    Employer: [employer]  Tobacco Use   Smoking status: Former   Smokeless tobacco: Former    Types: Snuff    Quit date: 10/10/2013   Tobacco comments:    Quit 2014  Substance and Sexual Activity   Alcohol use: Yes    Alcohol/week: 1.0 standard drink of alcohol    Types: 1 Standard drinks or equivalent per week    Comment: Social    Drug use: No   Sexual activity: Not on file  Other Topics Concern   Not on file  Social History Narrative   Patient lives at home with his wife and children Channie)   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   Education some college.   Left handed.    Caffeine tea three or four when he is at work. Un sweet tea.   Social Drivers of Corporate investment banker Strain: Not on file  Food Insecurity: Not on file  Transportation Needs: Not on file  Physical Activity: Not on file  Stress: Not on file  Social Connections: Not on file        Objective:  Physical Exam: BP 112/80   Pulse 92   Temp 98.2 F (36.8 C) (Temporal)   Ht 5' 10 (1.778 m)   Wt 249 lb (112.9 kg)   SpO2 97%   BMI 35.73 kg/m   Body mass index is 35.73 kg/m. Wt Readings from Last 3 Encounters:  08/31/24 249 lb (112.9 kg)  10/05/23 256 lb (116.1 kg)  08/31/23 252 lb 6.4 oz (114.5 kg)   Gen: NAD, resting comfortably HEENT: TMs normal bilaterally. OP clear. No thyromegaly noted.  CV: RRR with no murmurs appreciated Pulm: NWOB, CTAB with no crackles, wheezes, or  rhonchi GI: Normal bowel sounds present. Soft, Nontender, Nondistended. MSK: no edema, cyanosis, or clubbing noted Skin: warm, dry Neuro: CN2-12 grossly intact. Strength 5/5 in upper and lower extremities. Reflexes symmetric and intact bilaterally.  Psych: Normal affect and thought content     Arriyanna Mersch M. Kennyth, MD 08/31/2024 8:07 AM

## 2024-08-31 NOTE — Assessment & Plan Note (Signed)
 At goal today.  He has had significant improvement in blood pressure readings since starting CPAP.  He will monitor at home and let us  know if persistently elevated.

## 2024-08-31 NOTE — Assessment & Plan Note (Signed)
Check A1c with labs. 

## 2024-08-31 NOTE — Assessment & Plan Note (Signed)
 He is now following with [facility] endocrinology.  Will check labs today.  He is on testosterone  replacement.

## 2024-08-31 NOTE — Assessment & Plan Note (Signed)
 Doing well with CPAP.  Has had significant improvement in sleep quality.

## 2024-08-31 NOTE — Patient Instructions (Signed)
 It was very nice to see you today!  VISIT SUMMARY: You had your annual physical exam today. Your blood pressure is well-controlled, and you are making positive lifestyle changes. We discussed your CPAP use, testosterone  therapy, and overall health maintenance.  YOUR PLAN: ADULT WELLNESS VISIT: This was your annual wellness visit to check your overall health. -Your blood pressure is well-controlled, likely helped by your CPAP use. -We ordered blood work, including cholesterol and A1c. -Continue your regular exercise and healthy diet. -You are referred to a gastroenterologist for a colonoscopy due to your age. -We discussed the flu vaccine, but you decided to hold off for now.  OBSTRUCTIVE SLEEP APNEA: Your obstructive sleep apnea is being managed with a CPAP machine. -Continue using your CPAP machine nightly as it has significantly improved your sleep quality and blood pressure control.  OVERWEIGHT: You are managing your weight with positive lifestyle changes. -Continue your regular exercise and healthy diet, which have helped you lose seven pounds since last year.  TESTOSTERONE  DEFICIENCY: Your testosterone  deficiency is being managed with supplementation. -We ordered a testosterone  level test as part of your blood work to compare with your results from [facility].  Return in about 1 year (around 08/31/2025) for Annual Physical.   Take care, Dr Kennyth  PLEASE NOTE:  If you had any lab tests, please let us  know if you have not heard back within a few days. You may see your results on mychart before we have a chance to review them but we will give you a call once they are reviewed by us .   If we ordered any referrals today, please let us  know if you have not heard from their office within the next week.   If you had any urgent prescriptions sent in today, please check with the pharmacy within an hour of our visit to make sure the prescription was transmitted appropriately.    Please try these tips to maintain a healthy lifestyle:  Eat at least 3 REAL meals and 1-2 snacks per day.  Aim for no more than 5 hours between eating.  If you eat breakfast, please do so within one hour of getting up.   Each meal should contain half fruits/vegetables, one quarter protein, and one quarter carbs (no bigger than a computer mouse)  Cut down on sweet beverages. This includes juice, soda, and sweet tea.   Drink at least 1 glass of water with each meal and aim for at least 8 glasses per day  Exercise at least 150 minutes every week.    Preventive Care 45-58 Years Old, Male Preventive care refers to lifestyle choices and visits with your health care provider that can promote health and wellness. Preventive care visits are also called wellness exams. What can I expect for my preventive care visit? Counseling During your preventive care visit, your health care provider may ask about your: Medical history, including: Past medical problems. Family medical history. Current health, including: Emotional well-being. Home life and relationship well-being. Sexual activity. Lifestyle, including: Alcohol, nicotine or tobacco, and drug use. Access to firearms. Diet, exercise, and sleep habits. Safety issues such as seatbelt and bike helmet use. Sunscreen use. Work and work Astronomer. Physical exam Your health care provider will check your: Height and weight. These may be used to calculate your BMI (body mass index). BMI is a measurement that tells if you are at a healthy weight. Waist circumference. This measures the distance around your waistline. This measurement also tells if you  are at a healthy weight and may help predict your risk of certain diseases, such as type 2 diabetes and high blood pressure. Heart rate and blood pressure. Body temperature. Skin for abnormal spots. What immunizations do I need?  Vaccines are usually given at various ages, according to a  schedule. Your health care provider will recommend vaccines for you based on your age, medical history, and lifestyle or other factors, such as travel or where you work. What tests do I need? Screening Your health care provider may recommend screening tests for certain conditions. This may include: Lipid and cholesterol levels. Diabetes screening. This is done by checking your blood sugar (glucose) after you have not eaten for a while (fasting). Hepatitis B test. Hepatitis C test. HIV (human immunodeficiency virus) test. STI (sexually transmitted infection) testing, if you are at risk. Lung cancer screening. Prostate cancer screening. Colorectal cancer screening. Talk with your health care provider about your test results, treatment options, and if necessary, the need for more tests. Follow these instructions at home: Eating and drinking  Eat a diet that includes fresh fruits and vegetables, whole grains, lean protein, and low-fat dairy products. Take vitamin and mineral supplements as recommended by your health care provider. Do not drink alcohol if your health care provider tells you not to drink. If you drink alcohol: Limit how much you have to 0-2 drinks a day. Know how much alcohol is in your drink. In the [place] one drink equals one 12 oz bottle of beer (355 mL), one 5 oz glass of wine (148 mL), or one 1 oz glass of hard liquor (44 mL). Lifestyle Brush your teeth every morning and night with fluoride toothpaste. Floss one time each day. Exercise for at least 30 minutes 5 or more days each week. Do not use any products that contain nicotine or tobacco. These products include cigarettes, chewing tobacco, and vaping devices, such as e-cigarettes. If you need help quitting, ask your health care provider. Do not use drugs. If you are sexually active, practice safe sex. Use a condom or other form of protection to prevent STIs. Take aspirin only as told by your health care provider. Make  sure that you understand how much to take and what form to take. Work with your health care provider to find out whether it is safe and beneficial for you to take aspirin daily. Find healthy ways to manage stress, such as: Meditation, yoga, or listening to music. Journaling. Talking to a trusted person. Spending time with friends and family. Minimize exposure to UV radiation to reduce your risk of skin cancer. Safety Always wear your seat belt while driving or riding in a vehicle. Do not drive: If you have been drinking alcohol. Do not ride with someone who has been drinking. When you are tired or distracted. While texting. If you have been using any mind-altering substances or drugs. Wear a helmet and other protective equipment during sports activities. If you have firearms in your house, make sure you follow all gun safety procedures. What's next? Go to your health care provider once a year for an annual wellness visit. Ask your health care provider how often you should have your eyes and teeth checked. Stay up to date on all vaccines. This information is not intended to replace advice given to you by your health care provider. Make sure you discuss any questions you have with your health care provider. Document Revised: 04/24/2021 Document Reviewed: 04/24/2021 Elsevier Patient Education  2024 [employer].

## 2024-08-31 NOTE — Assessment & Plan Note (Signed)
 Check lipids

## 2024-09-01 ENCOUNTER — Ambulatory Visit: Payer: Self-pay | Admitting: Family Medicine

## 2024-09-01 NOTE — Progress Notes (Signed)
 His red blood cells are up a bit since last year.  This is probably due to the testosterone  replacement.  Do not need to do anything urgently for this at this point however it is something we should continue to monitor going forward.  His endocrinology should be monitoring for this as well.  The rest of his labs are all stable.  Cholesterol levels are about the same as last year.  All his other labs are at goal.  He should keep up the great work with diet and exercise and we can recheck everything in a year or so.

## 2024-09-21 ENCOUNTER — Telehealth: Payer: Self-pay | Admitting: Family Medicine

## 2024-09-21 NOTE — Telephone Encounter (Signed)
 Type of form received: Physician Results Form  Additional comments:   Received by: Etta Moats should be Faxed to:  Form should be mailed to:    Is patient requesting call for pickup: Yes   Form placed:  Provider's Box  Attach charge sheet. Yes  Individual made aware of 3-5 business day turn around (Y/N)? Y

## 2024-09-23 NOTE — Telephone Encounter (Signed)
 Patient notified form ready to be pickup  Placed in front office, copy placed to be scan in patient chart

## 2024-09-27 ENCOUNTER — Ambulatory Visit
Admission: RE | Admit: 2024-09-27 | Discharge: 2024-09-27 | Disposition: A | Discharge status: Home / Self Care | Source: Ambulatory Visit | Attending: Nurse Practitioner | Admitting: Nurse Practitioner

## 2024-09-27 ENCOUNTER — Other Ambulatory Visit: Payer: Self-pay | Admitting: Nurse Practitioner

## 2024-09-27 DIAGNOSIS — Z0000 Encounter for general adult medical examination without abnormal findings: Secondary | ICD-10-CM

## 2024-10-19 ENCOUNTER — Encounter: Payer: Self-pay | Admitting: Gastroenterology

## 2024-10-20 ENCOUNTER — Other Ambulatory Visit (HOSPITAL_BASED_OUTPATIENT_CLINIC_OR_DEPARTMENT_OTHER): Payer: Self-pay

## 2024-10-20 ENCOUNTER — Other Ambulatory Visit: Payer: Self-pay

## 2024-10-20 MED ORDER — THYROID 30 MG PO TABS
30.0000 mg | ORAL_TABLET | Freq: Every day | ORAL | 0 refills | Status: DC
  Filled 2024-11-15: qty 30, 30d supply, fill #0

## 2024-10-20 MED ORDER — TESTOSTERONE CYPIONATE 200 MG/ML IM SOLN
200.0000 mg | INTRAMUSCULAR | 2 refills | Status: AC
  Filled 2024-10-20 – 2024-11-04 (×4): qty 4, 28d supply, fill #0
  Filled 2024-12-02: qty 4, 28d supply, fill #1

## 2024-10-20 MED ORDER — THYROID 30 MG PO TABS
30.0000 mg | ORAL_TABLET | Freq: Every day | ORAL | 0 refills | Status: AC
  Filled 2024-10-20: qty 30, 30d supply, fill #0

## 2024-10-21 ENCOUNTER — Other Ambulatory Visit (HOSPITAL_BASED_OUTPATIENT_CLINIC_OR_DEPARTMENT_OTHER): Payer: Self-pay

## 2024-10-31 ENCOUNTER — Other Ambulatory Visit (HOSPITAL_BASED_OUTPATIENT_CLINIC_OR_DEPARTMENT_OTHER): Payer: Self-pay

## 2024-11-04 ENCOUNTER — Other Ambulatory Visit (HOSPITAL_BASED_OUTPATIENT_CLINIC_OR_DEPARTMENT_OTHER): Payer: Self-pay

## 2024-11-15 ENCOUNTER — Other Ambulatory Visit (HOSPITAL_BASED_OUTPATIENT_CLINIC_OR_DEPARTMENT_OTHER): Payer: Self-pay

## 2024-11-15 ENCOUNTER — Other Ambulatory Visit: Payer: Self-pay

## 2024-11-21 ENCOUNTER — Ambulatory Visit: Payer: Self-pay

## 2024-11-21 ENCOUNTER — Other Ambulatory Visit (HOSPITAL_BASED_OUTPATIENT_CLINIC_OR_DEPARTMENT_OTHER): Payer: Self-pay

## 2024-11-21 VITALS — Ht 70.0 in | Wt 258.0 lb

## 2024-11-21 DIAGNOSIS — Z1211 Encounter for screening for malignant neoplasm of colon: Secondary | ICD-10-CM

## 2024-11-21 MED ORDER — NA SULFATE-K SULFATE-MG SULF 17.5-3.13-1.6 GM/177ML PO SOLN
1.0000 | Freq: Once | ORAL | 0 refills | Status: AC
  Filled 2024-11-21: qty 354, 1d supply, fill #0

## 2024-11-21 NOTE — Progress Notes (Signed)
 RN confirmed patient name, date of birth, and address RN confirmed date and time of procedure RN reviewed and confirmed allergies  RN reviewed and updated current medications; confirmed preferred pharmacy Pt is not on diet pills nor GLP-1 medications Pt is not on blood thinners RN reviewed medical & surgical hx  Pt denies issues with chronic constipation  Diabetic - no No A fib or A flutter No cardiac tests are pending  Pt is not on home 02  No issues known with past sedation with any surgeries or procedures Patient denies ever being told they had issues or difficulty with intubation  Patient unaware of any fh of malignant hyperthermia Ambulates independently RN reviewed prep instructions and explained time frames for holding certain medications RN answered patient questions; patient stated understanding Prep instructions sent

## 2024-11-22 ENCOUNTER — Other Ambulatory Visit (HOSPITAL_BASED_OUTPATIENT_CLINIC_OR_DEPARTMENT_OTHER): Payer: Self-pay

## 2024-12-01 ENCOUNTER — Other Ambulatory Visit (HOSPITAL_BASED_OUTPATIENT_CLINIC_OR_DEPARTMENT_OTHER): Payer: Self-pay

## 2024-12-01 ENCOUNTER — Encounter: Payer: Self-pay | Admitting: Gastroenterology

## 2024-12-02 ENCOUNTER — Other Ambulatory Visit (HOSPITAL_BASED_OUTPATIENT_CLINIC_OR_DEPARTMENT_OTHER): Payer: Self-pay

## 2024-12-02 ENCOUNTER — Other Ambulatory Visit: Payer: Self-pay

## 2024-12-13 ENCOUNTER — Other Ambulatory Visit (HOSPITAL_BASED_OUTPATIENT_CLINIC_OR_DEPARTMENT_OTHER): Payer: Self-pay

## 2024-12-13 ENCOUNTER — Encounter: Payer: Self-pay | Admitting: Gastroenterology

## 2024-12-13 ENCOUNTER — Ambulatory Visit: Admitting: Gastroenterology

## 2024-12-13 VITALS — BP 120/62 | HR 80 | Temp 97.9°F | Resp 15 | Ht 70.0 in | Wt 258.0 lb

## 2024-12-13 DIAGNOSIS — D123 Benign neoplasm of transverse colon: Secondary | ICD-10-CM

## 2024-12-13 DIAGNOSIS — D122 Benign neoplasm of ascending colon: Secondary | ICD-10-CM

## 2024-12-13 DIAGNOSIS — Z1211 Encounter for screening for malignant neoplasm of colon: Secondary | ICD-10-CM

## 2024-12-13 DIAGNOSIS — D124 Benign neoplasm of descending colon: Secondary | ICD-10-CM

## 2024-12-13 MED ORDER — SODIUM CHLORIDE 0.9 % IV SOLN: 500.0000 mL | Freq: Once | INTRAVENOUS | Status: DC

## 2024-12-13 NOTE — Patient Instructions (Addendum)
 Resume previous diet, High fiber diet is recommended Continue present medications Use fibercon 1-2 tablets daily Await pathology results Repeat colonoscopy with extra miralax/dulcolax for one week prior plus 2 day prep  Handouts/information given for polyps and hemorrhoids  YOU HAD AN ENDOSCOPIC PROCEDURE TODAY AT THE [facility]:   Refer to the procedure report that was given to you for any specific questions about what was found during the examination.  If the procedure report does not answer your questions, please call your gastroenterologist to clarify.  If you requested that your care partner not be given the details of your procedure findings, then the procedure report has been included in a sealed envelope for you to review at your convenience later.  YOU SHOULD EXPECT: Some feelings of bloating in the abdomen. Passage of more gas than usual.  Walking can help get rid of the air that was put into your GI tract during the procedure and reduce the bloating. If you had a lower endoscopy (such as a colonoscopy or flexible sigmoidoscopy) you may notice spotting of blood in your stool or on the toilet paper. If you underwent a bowel prep for your procedure, you may not have a normal bowel movement for a few days.  Please Note:  You might notice some irritation and congestion in your nose or some drainage.  This is from the oxygen used during your procedure.  There is no need for concern and it should clear up in a day or so.  SYMPTOMS TO REPORT IMMEDIATELY:  Following lower endoscopy (colonoscopy):  Excessive amounts of blood in the stool  Significant tenderness or worsening of abdominal pains  Swelling of the abdomen that is new, acute  Fever of 100F or higher For urgent or emergent issues, a gastroenterologist can be reached at any hour by calling [phone]. Do not use MyChart messaging for urgent concerns.   DIET:  We do recommend a small meal at first, but then you may  proceed to your regular diet.  Drink plenty of fluids but you should avoid alcoholic beverages for 24 hours.  ACTIVITY:  You should plan to take it easy for the rest of today and you should NOT DRIVE or use heavy machinery until tomorrow (because of the sedation medicines used during the test).    FOLLOW UP: Our staff will call the number listed on your records the next business day following your procedure.  We will call around 7:15- 8:00 am to check on you and address any questions or concerns that you may have regarding the information given to you following your procedure. If we do not reach you, we will leave a message.     If any biopsies were taken you will be contacted by phone or by letter within the next 1-3 weeks.  Please call us  at [phone] if you have not heard about the biopsies in 3 weeks.   SIGNATURES/CONFIDENTIALITY: You and/or your care partner have signed paperwork which will be entered into your electronic medical record.  These signatures attest to the fact that that the information above on your After Visit Summary has been reviewed and is understood.  Full responsibility of the confidentiality of this discharge information lies with you and/or your care-partner.

## 2024-12-13 NOTE — Progress Notes (Unsigned)
 A/O x 3, VSS, good SR's, pleased with anesthesia, report to RN

## 2024-12-13 NOTE — Progress Notes (Unsigned)
 Called to room to assist during endoscopic procedure.  Patient ID and intended procedure confirmed with present staff. Received instructions for my participation in the procedure from the performing physician.

## 2024-12-13 NOTE — Progress Notes (Unsigned)
 "  GASTROENTEROLOGY PROCEDURE H&P NOTE   Primary Care Physician: Kennyth Worth HERO, MD  HPI: Michael Collier is a 44 y.o. male who presents for colonoscopy for screening.  Past Medical History:  Diagnosis Date   Asthma    Migraine    Sleep apnea    Past Surgical History:  Procedure Laterality Date   None     Current Outpatient Medications  Medication Sig Dispense Refill   anastrozole (ARIMIDEX) 1 MG tablet Take 1 mg by mouth once a week. (Patient taking differently: Take 0.5 mg by mouth. Every other week)     Ascorbic Acid (VITAMIN C) 100 MG tablet Take 100 mg by mouth daily.     cetirizine (ZYRTEC) 10 MG tablet Take 10 mg by mouth daily as needed for allergies.      Diindolylmethane POWD 200 mg by Does not apply route daily.     IBUPROFEN PO Take by mouth as needed.     MAGNESIUM  GLYCINATE PO Take 500 mg by mouth daily.     testosterone  cypionate (DEPOTESTOSTERONE CYPIONATE) 200 MG/ML injection Inject 1 mL (200 mg total) into the muscle once a week. 4 mL 2   thyroid  (ARMOUR THYROID ) 30 MG tablet Take 1 tablet (30 mg total) by mouth daily on an empty stomach. 30 tablet 0   thyroid  (ARMOUR THYROID ) 30 MG tablet Take 1 tablet (30 mg total) by mouth daily on an empty stomach. 90 tablet 0   No current facility-administered medications for this visit.   Current Medications[1] Allergies[2] Family History  Problem Relation Age of Onset   Hypertension Mother    Asthma Mother    Hypertension Father    Hyperlipidemia Father    Arthritis Father    Cancer Other    Colon cancer Neg Hx    Esophageal cancer Neg Hx    Rectal cancer Neg Hx    Stomach cancer Neg Hx    Colon polyps Neg Hx    Social History   Socioeconomic History   Marital status: Married    Spouse name: [name]   Number of children: 2   Years of education: college   Highest education level: Not on file  Occupational History    Employer: [employer]  Tobacco Use   Smoking status: Former   Smokeless  tobacco: Current    Types: Snuff    Last attempt to quit: 10/10/2013   Tobacco comments:    Nicotine pouches  Vaping Use   Vaping status: Never Used  Substance and Sexual Activity   Alcohol use: Yes    Alcohol/week: 1.0 standard drink of alcohol    Types: 1 Standard drinks or equivalent per week    Comment: Social    Drug use: No   Sexual activity: Not on file  Other Topics Concern   Not on file  Social History Narrative   Patient lives at home with his wife and children Channie)   Patient works full time for [employer].   Education some college.   Left handed.   Caffeine tea three or four when he is at work. Un sweet tea.   Social Drivers of Health   Tobacco Use: High Risk (11/21/2024)   Patient History    Smoking Tobacco Use: Former    Smokeless Tobacco Use: Current    Passive Exposure: Not on Actuary Strain: Not on file  Food Insecurity: Not on file  Transportation Needs: Not on file  Physical  Activity: Not on file  Stress: Not on file  Social Connections: Not on file  Intimate Partner Violence: Not on file  Depression [phone]): Low Risk (08/31/2024)   Depression (PHQ2-9)    PHQ-2 Score: 0  Alcohol Screen: Not on file  Housing: Not on file  Utilities: Not on file  Health Literacy: Not on file    Physical Exam: There were no vitals filed for this visit. There is no height or weight on file to calculate BMI. GEN: NAD EYE: Sclerae anicteric ENT: MMM CV: Non-tachycardic GI: Soft, NT/ND NEURO:  Alert & Oriented x 3  Lab Results: No results for input(s): WBC, HGB, HCT, PLT in the last 72 hours. BMET No results for input(s): NA, K, CL, CO2, GLUCOSE, BUN, CREATININE, CALCIUM in the last 72 hours. LFT No results for input(s): PROT, ALBUMIN, AST, ALT, ALKPHOS, BILITOT, BILIDIR, IBILI in the last 72 hours. PT/INR No results for input(s): LABPROT, INR in the last 72  hours.   Impression / Plan: This is a 44 y.o.male who presents for colonoscopy for screening.  The risks and benefits of endoscopic evaluation/treatment were discussed with the patient and/or family; these include but are not limited to the risk of perforation, infection, bleeding, missed lesions, lack of diagnosis, severe illness requiring hospitalization, as well as anesthesia and sedation related illnesses.  The patient's history has been reviewed, patient examined, no change in status, and deemed stable for procedure.  The patient and/or family was provided an opportunity to ask questions and all were answered.  The patient and/or family is agreeable to proceed.    Aloha Finner, MD [facility] Office # [phone]     [1]  Current Outpatient Medications:    anastrozole (ARIMIDEX) 1 MG tablet, Take 1 mg by mouth once a week. (Patient taking differently: Take 0.5 mg by mouth. Every other week), Disp: , Rfl:    Ascorbic Acid (VITAMIN C) 100 MG tablet, Take 100 mg by mouth daily., Disp: , Rfl:    cetirizine (ZYRTEC) 10 MG tablet, Take 10 mg by mouth daily as needed for allergies. , Disp: , Rfl:    Diindolylmethane POWD, 200 mg by Does not apply route daily., Disp: , Rfl:    IBUPROFEN PO, Take by mouth as needed., Disp: , Rfl:    MAGNESIUM  GLYCINATE PO, Take 500 mg by mouth daily., Disp: , Rfl:    testosterone  cypionate (DEPOTESTOSTERONE CYPIONATE) 200 MG/ML injection, Inject 1 mL (200 mg total) into the muscle once a week., Disp: 4 mL, Rfl: 2   thyroid  (ARMOUR THYROID ) 30 MG tablet, Take 1 tablet (30 mg total) by mouth daily on an empty stomach., Disp: 30 tablet, Rfl: 0   thyroid  (ARMOUR THYROID ) 30 MG tablet, Take 1 tablet (30 mg total) by mouth daily on an empty stomach., Disp: 90 tablet, Rfl: 0 [2] No Known Allergies  "

## 2024-12-14 ENCOUNTER — Telehealth: Payer: Self-pay | Admitting: *Deleted

## 2024-12-14 NOTE — Telephone Encounter (Signed)
" °  Follow up Call-     12/13/2024   10:38 AM 12/13/2024   10:32 AM  Call back number  Post procedure Call Back phone  # [phone]   Permission to leave phone message  Yes     Patient questions:  Do you have a fever, pain , or abdominal swelling? No. Pain Score  0 *  Have you tolerated food without any problems? Yes.    Have you been able to return to your normal activities? Yes.    Do you have any questions about your discharge instructions: Diet   No. Medications  No. Follow up visit  No.  Do you have questions or concerns about your Care? No.  Actions: * If pain score is 4 or above: No action needed, pain <4.   "

## 2025-01-10 ENCOUNTER — Telehealth: Admitting: Adult Health

## 2025-01-24 ENCOUNTER — Telehealth: Admitting: Adult Health

## 2025-03-03 ENCOUNTER — Telehealth: Admitting: Adult Health

## 2025-09-06 ENCOUNTER — Encounter: Admitting: Family Medicine
